# Patient Record
Sex: Female | Born: 1980 | Race: White | Hispanic: No | Marital: Married | Smoking: Former smoker
Health system: Southern US, Community
[De-identification: ages and names within clinical notes are randomized; demographics above are authoritative.]

## PROBLEM LIST (undated history)

## (undated) ENCOUNTER — Inpatient Hospital Stay (HOSPITAL_COMMUNITY): Payer: Self-pay

## (undated) DIAGNOSIS — F329 Major depressive disorder, single episode, unspecified: Secondary | ICD-10-CM

## (undated) DIAGNOSIS — F32A Depression, unspecified: Secondary | ICD-10-CM

## (undated) DIAGNOSIS — Z8619 Personal history of other infectious and parasitic diseases: Secondary | ICD-10-CM

## (undated) DIAGNOSIS — F419 Anxiety disorder, unspecified: Secondary | ICD-10-CM

## (undated) DIAGNOSIS — O139 Gestational [pregnancy-induced] hypertension without significant proteinuria, unspecified trimester: Secondary | ICD-10-CM

## (undated) HISTORY — DX: Gestational (pregnancy-induced) hypertension without significant proteinuria, unspecified trimester: O13.9

## (undated) HISTORY — DX: Personal history of other infectious and parasitic diseases: Z86.19

## (undated) HISTORY — PX: NECK SURGERY: SHX720

## (undated) HISTORY — PX: WISDOM TOOTH EXTRACTION: SHX21

## (undated) HISTORY — PX: BUNIONECTOMY: SHX129

---

## 2001-11-23 ENCOUNTER — Emergency Department (HOSPITAL_COMMUNITY): Admission: EM | Admit: 2001-11-23 | Discharge: 2001-11-23 | Payer: Self-pay | Admitting: Physical Therapy

## 2002-11-08 ENCOUNTER — Emergency Department (HOSPITAL_COMMUNITY): Admission: EM | Admit: 2002-11-08 | Discharge: 2002-11-08 | Payer: Self-pay | Admitting: Emergency Medicine

## 2004-10-24 ENCOUNTER — Other Ambulatory Visit: Admission: RE | Admit: 2004-10-24 | Discharge: 2004-10-24 | Payer: Self-pay | Admitting: Family Medicine

## 2007-06-29 ENCOUNTER — Other Ambulatory Visit: Admission: RE | Admit: 2007-06-29 | Discharge: 2007-06-29 | Payer: Self-pay | Admitting: Family Medicine

## 2009-01-29 ENCOUNTER — Other Ambulatory Visit: Admission: RE | Admit: 2009-01-29 | Discharge: 2009-01-29 | Payer: Self-pay | Admitting: Family Medicine

## 2009-08-28 ENCOUNTER — Emergency Department (HOSPITAL_COMMUNITY): Admission: EM | Admit: 2009-08-28 | Discharge: 2009-08-28 | Payer: Self-pay | Admitting: Emergency Medicine

## 2010-03-14 ENCOUNTER — Other Ambulatory Visit: Admission: RE | Admit: 2010-03-14 | Discharge: 2010-03-14 | Payer: Self-pay | Admitting: Family Medicine

## 2010-10-19 NOTE — L&D Delivery Note (Signed)
Delivery Note At 1:23 PM a viable female was delivered via  (Presentation: ;  ).  APGAR:9/9 , ; weight 5 lb 11.9 oz (2605 g).   Placenta status intact: , 3 vessel.  Cord:  with the following complications: .  Cord pH: pending  Anesthesia:  epidual Episiotomy: none Lacerations: second degree Suture Repair: chromic Est. Blood Loss (mL):   Mom to postpartum.  Baby to nursery-stable some retractions nursery to eval.  Ravinder Hofland S 07/19/2011, 1:42 PM

## 2011-01-13 LAB — HIV ANTIBODY (ROUTINE TESTING W REFLEX): HIV: NONREACTIVE

## 2011-01-13 LAB — ANTIBODY SCREEN: Antibody Screen: NEGATIVE

## 2011-01-13 LAB — ABO/RH: RH Type: POSITIVE

## 2011-06-20 ENCOUNTER — Inpatient Hospital Stay (HOSPITAL_COMMUNITY): Admission: AD | Admit: 2011-06-20 | Payer: Self-pay | Source: Ambulatory Visit | Admitting: Obstetrics and Gynecology

## 2011-07-18 ENCOUNTER — Inpatient Hospital Stay (HOSPITAL_COMMUNITY)
Admission: AD | Admit: 2011-07-18 | Discharge: 2011-07-21 | DRG: 775 | Disposition: A | Discharge status: Home / Self Care | Payer: 59 | Source: Ambulatory Visit | Attending: Obstetrics and Gynecology | Admitting: Obstetrics and Gynecology

## 2011-07-18 ENCOUNTER — Encounter (HOSPITAL_COMMUNITY): Payer: Self-pay | Admitting: *Deleted

## 2011-07-18 HISTORY — DX: Depression, unspecified: F32.A

## 2011-07-18 HISTORY — DX: Anxiety disorder, unspecified: F41.9

## 2011-07-18 HISTORY — DX: Major depressive disorder, single episode, unspecified: F32.9

## 2011-07-18 NOTE — Progress Notes (Signed)
"  I started having UC's while I was eating.  I began to feel nauseated, I laid down, had sone water to drink and UC's kept coming.  That's when I called the RN line.  No leaking of water or bleeding from vagina.  (+) FM."

## 2011-07-18 NOTE — Progress Notes (Signed)
Pt states, " I started having cramping in my low abdomen at 2200 tonight. They are infrequent."

## 2011-07-19 ENCOUNTER — Other Ambulatory Visit: Payer: Self-pay | Admitting: Obstetrics and Gynecology

## 2011-07-19 ENCOUNTER — Encounter (HOSPITAL_COMMUNITY): Payer: Self-pay | Admitting: Anesthesiology

## 2011-07-19 ENCOUNTER — Inpatient Hospital Stay (HOSPITAL_COMMUNITY): Payer: 59 | Admitting: Anesthesiology

## 2011-07-19 ENCOUNTER — Encounter (HOSPITAL_COMMUNITY): Payer: Self-pay | Admitting: *Deleted

## 2011-07-19 LAB — CBC
Hemoglobin: 12.9 g/dL (ref 12.0–15.0)
MCH: 31.4 pg (ref 26.0–34.0)
Platelets: 245 10*3/uL (ref 150–400)
RBC: 4.11 MIL/uL (ref 3.87–5.11)
WBC: 18.3 10*3/uL — ABNORMAL HIGH (ref 4.0–10.5)

## 2011-07-19 LAB — URINALYSIS, ROUTINE W REFLEX MICROSCOPIC
Glucose, UA: NEGATIVE mg/dL
Leukocytes, UA: NEGATIVE
Nitrite: NEGATIVE
Specific Gravity, Urine: <1.005 — ABNORMAL LOW (ref 1.005–1.030)
pH: 6 (ref 5.0–8.0)

## 2011-07-19 LAB — URINE MICROSCOPIC-ADD ON

## 2011-07-19 LAB — RPR: RPR Ser Ql: NONREACTIVE

## 2011-07-19 MED ORDER — BENZOCAINE-MENTHOL 20-0.5 % EX AERO
1.0000 "application " | INHALATION_SPRAY | CUTANEOUS | Status: DC | PRN
  Administered 2011-07-19 – 2011-07-21 (×2): 1 via TOPICAL

## 2011-07-19 MED ORDER — LACTATED RINGERS IV SOLN: 500.0000 mL | INTRAVENOUS | Status: DC | PRN

## 2011-07-19 MED ORDER — ACETAMINOPHEN 325 MG PO TABS: 650.0000 mg | ORAL_TABLET | ORAL | Status: DC | PRN

## 2011-07-19 MED ORDER — DIPHENHYDRAMINE HCL 25 MG PO CAPS: 25.0000 mg | ORAL_CAPSULE | Freq: Four times a day (QID) | ORAL | Status: DC | PRN

## 2011-07-19 MED ORDER — ONDANSETRON HCL 4 MG/2ML IJ SOLN: 4.0000 mg | INTRAMUSCULAR | Status: DC | PRN

## 2011-07-19 MED ORDER — PHENYLEPHRINE 40 MCG/ML (10ML) SYRINGE FOR IV PUSH (FOR BLOOD PRESSURE SUPPORT)
80.0000 ug | PREFILLED_SYRINGE | INTRAVENOUS | Status: DC | PRN
  Filled 2011-07-19: qty 5

## 2011-07-19 MED ORDER — TERBUTALINE SULFATE 1 MG/ML IJ SOLN
INTRAMUSCULAR | Status: AC
  Administered 2011-07-19: 0.25 mg via SUBCUTANEOUS
  Filled 2011-07-19: qty 1

## 2011-07-19 MED ORDER — OXYTOCIN 20 UNITS IN LACTATED RINGERS INFUSION - SIMPLE
1.0000 m[IU]/min | INTRAVENOUS | Status: DC
  Filled 2011-07-19: qty 1000

## 2011-07-19 MED ORDER — TETANUS-DIPHTH-ACELL PERTUSSIS 5-2.5-18.5 LF-MCG/0.5 IM SUSP: 0.5000 mL | Freq: Once | INTRAMUSCULAR | Status: DC

## 2011-07-19 MED ORDER — PRENATAL PLUS 27-1 MG PO TABS
1.0000 | ORAL_TABLET | Freq: Every day | ORAL | Status: DC
  Administered 2011-07-20 – 2011-07-21 (×2): 1 via ORAL
  Filled 2011-07-19 (×2): qty 1

## 2011-07-19 MED ORDER — PRENATAL PLUS 27-1 MG PO TABS: 1.0000 | ORAL_TABLET | Freq: Every day | ORAL | Status: DC

## 2011-07-19 MED ORDER — BUPROPION HCL ER (XL) 150 MG PO TB24
150.0000 mg | ORAL_TABLET | Freq: Every day | ORAL | Status: DC
  Administered 2011-07-19 – 2011-07-21 (×3): 150 mg via ORAL
  Filled 2011-07-19 (×4): qty 1

## 2011-07-19 MED ORDER — EPHEDRINE 5 MG/ML INJ
10.0000 mg | INTRAVENOUS | Status: DC | PRN
  Filled 2011-07-19: qty 4

## 2011-07-19 MED ORDER — DIBUCAINE 1 % RE OINT: 1.0000 "application " | TOPICAL_OINTMENT | RECTAL | Status: DC | PRN

## 2011-07-19 MED ORDER — IBUPROFEN 600 MG PO TABS: 600.0000 mg | ORAL_TABLET | Freq: Four times a day (QID) | ORAL | Status: DC | PRN

## 2011-07-19 MED ORDER — FLEET ENEMA 7-19 GM/118ML RE ENEM: 1.0000 | ENEMA | RECTAL | Status: DC | PRN

## 2011-07-19 MED ORDER — LIDOCAINE HCL 1.5 % IJ SOLN
INTRAMUSCULAR | Status: DC | PRN
  Administered 2011-07-19 (×2): 5 mL via INTRADERMAL

## 2011-07-19 MED ORDER — BENZOCAINE-MENTHOL 20-0.5 % EX AERO
INHALATION_SPRAY | CUTANEOUS | Status: AC
  Administered 2011-07-19: 1 via TOPICAL
  Filled 2011-07-19: qty 56

## 2011-07-19 MED ORDER — CLINDAMYCIN PHOSPHATE 900 MG/50ML IV SOLN
900.0000 mg | Freq: Three times a day (TID) | INTRAVENOUS | Status: DC
  Administered 2011-07-19 (×2): 900 mg via INTRAVENOUS
  Filled 2011-07-19 (×4): qty 50

## 2011-07-19 MED ORDER — SENNOSIDES-DOCUSATE SODIUM 8.6-50 MG PO TABS
2.0000 | ORAL_TABLET | Freq: Every day | ORAL | Status: DC
  Administered 2011-07-19 – 2011-07-20 (×2): 2 via ORAL

## 2011-07-19 MED ORDER — TERBUTALINE SULFATE 1 MG/ML IJ SOLN: 0.2500 mg | Freq: Once | INTRAMUSCULAR | Status: DC | PRN

## 2011-07-19 MED ORDER — IBUPROFEN 600 MG PO TABS
600.0000 mg | ORAL_TABLET | Freq: Four times a day (QID) | ORAL | Status: DC
  Administered 2011-07-19 – 2011-07-21 (×8): 600 mg via ORAL
  Filled 2011-07-19 (×8): qty 1

## 2011-07-19 MED ORDER — PHENYLEPHRINE 40 MCG/ML (10ML) SYRINGE FOR IV PUSH (FOR BLOOD PRESSURE SUPPORT)
80.0000 ug | PREFILLED_SYRINGE | INTRAVENOUS | Status: DC | PRN
  Filled 2011-07-19 (×2): qty 5

## 2011-07-19 MED ORDER — OXYCODONE-ACETAMINOPHEN 5-325 MG PO TABS
1.0000 | ORAL_TABLET | ORAL | Status: DC | PRN
Start: 2011-07-19 — End: 2011-07-21

## 2011-07-19 MED ORDER — ZOLPIDEM TARTRATE 5 MG PO TABS: 5.0000 mg | ORAL_TABLET | Freq: Every evening | ORAL | Status: DC | PRN

## 2011-07-19 MED ORDER — WITCH HAZEL-GLYCERIN EX PADS: 1.0000 "application " | MEDICATED_PAD | CUTANEOUS | Status: DC | PRN

## 2011-07-19 MED ORDER — FENTANYL 2.5 MCG/ML BUPIVACAINE 1/10 % EPIDURAL INFUSION (WH - ANES)
14.0000 mL/h | INTRAMUSCULAR | Status: DC
  Filled 2011-07-19: qty 60

## 2011-07-19 MED ORDER — LACTATED RINGERS IV SOLN: 500.0000 mL | Freq: Once | INTRAVENOUS | Status: DC

## 2011-07-19 MED ORDER — CITRIC ACID-SODIUM CITRATE 334-500 MG/5ML PO SOLN: 30.0000 mL | ORAL | Status: DC | PRN

## 2011-07-19 MED ORDER — LANOLIN HYDROUS EX OINT: TOPICAL_OINTMENT | CUTANEOUS | Status: DC | PRN

## 2011-07-19 MED ORDER — ONDANSETRON HCL 4 MG PO TABS: 4.0000 mg | ORAL_TABLET | ORAL | Status: DC | PRN

## 2011-07-19 MED ORDER — OXYTOCIN 20 UNITS IN LACTATED RINGERS INFUSION - SIMPLE: 125.0000 mL/h | Freq: Once | INTRAVENOUS | Status: DC

## 2011-07-19 MED ORDER — TERBUTALINE SULFATE 1 MG/ML IJ SOLN
0.2500 mg | Freq: Once | INTRAMUSCULAR | Status: AC
  Administered 2011-07-19: 0.25 mg via SUBCUTANEOUS

## 2011-07-19 MED ORDER — SIMETHICONE 80 MG PO CHEW: 80.0000 mg | CHEWABLE_TABLET | ORAL | Status: DC | PRN

## 2011-07-19 MED ORDER — OXYTOCIN BOLUS FROM INFUSION
500.0000 mL | Freq: Once | INTRAVENOUS | Status: DC
  Filled 2011-07-19: qty 500

## 2011-07-19 MED ORDER — ONDANSETRON HCL 4 MG/2ML IJ SOLN
4.0000 mg | Freq: Four times a day (QID) | INTRAMUSCULAR | Status: DC | PRN
  Administered 2011-07-19: 4 mg via INTRAVENOUS
  Filled 2011-07-19: qty 2

## 2011-07-19 MED ORDER — DIPHENHYDRAMINE HCL 50 MG/ML IJ SOLN: 12.5000 mg | INTRAMUSCULAR | Status: DC | PRN

## 2011-07-19 MED ORDER — LIDOCAINE HCL (PF) 1 % IJ SOLN
30.0000 mL | INTRAMUSCULAR | Status: DC | PRN
  Filled 2011-07-19 (×2): qty 30

## 2011-07-19 MED ORDER — LACTATED RINGERS IV SOLN
INTRAVENOUS | Status: DC
  Administered 2011-07-19: 09:00:00 via INTRAVENOUS
  Administered 2011-07-19: 125 mL/h via INTRAVENOUS

## 2011-07-19 MED ORDER — BISACODYL 10 MG RE SUPP: 10.0000 mg | Freq: Every day | RECTAL | Status: DC | PRN

## 2011-07-19 MED ORDER — FENTANYL 2.5 MCG/ML BUPIVACAINE 1/10 % EPIDURAL INFUSION (WH - ANES)
INTRAMUSCULAR | Status: DC | PRN
  Administered 2011-07-19: 14 mL/h via EPIDURAL

## 2011-07-19 MED ORDER — OXYCODONE-ACETAMINOPHEN 5-325 MG PO TABS: 2.0000 | ORAL_TABLET | ORAL | Status: DC | PRN

## 2011-07-19 MED ORDER — EPHEDRINE 5 MG/ML INJ
10.0000 mg | INTRAVENOUS | Status: DC | PRN
  Filled 2011-07-19 (×2): qty 4

## 2011-07-19 NOTE — ED Provider Notes (Signed)
History     Chief Complaint  Patient presents with  . Contractions   HPI Nicole Kelly 30 y.o. 35w 5d gestation comes in with contractions tonight.  This is the first time her contractions have been regular and she is having lower abdominal pain with the contractions.  No leaking.  No vaginal bleeding.  OB History    Grav Para Term Preterm Abortions TAB SAB Ect Mult Living   1               Past Medical History  Diagnosis Date  . No pertinent past medical history     Past Surgical History  Procedure Date  . No past surgeries     No family history on file.  History  Substance Use Topics  . Smoking status: Never Smoker   . Smokeless tobacco: Not on file  . Alcohol Use: No    Allergies:  Allergies  Allergen Reactions  . Penicillins Nausea Only  . Sulfa Antibiotics Nausea Only    Prescriptions prior to admission  Medication Sig Dispense Refill  . acetaminophen (TYLENOL) 500 MG tablet Take 500 mg by mouth every 6 (six) hours as needed.        Marland Kitchen buPROPion (WELLBUTRIN XL) 150 MG 24 hr tablet Take 150 mg by mouth daily.        . Calcium Carbonate Antacid (TUMS PO) Take 1 tablet by mouth daily.        . prenatal vitamin w/FE, FA (PRENATAL 1 + 1) 27-1 MG TABS Take 1 tablet by mouth daily.          Review of Systems  Genitourinary:       Uterine contractions    Physical Exam   Blood pressure 125/87, pulse 87, temperature 98.7 F (37.1 C), temperature source Oral, resp. rate 20, height 5\' 3"  (1.6 m), weight 141 lb (63.957 kg).  Physical Exam  Nursing note and vitals reviewed. Constitutional: She is oriented to person, place, and time. She appears well-developed and well-nourished.  HENT:  Head: Normocephalic.  Eyes: EOM are normal.  Neck: Neck supple.  GI: Soft.       Contractions noted FHT 125 baseline with 15x15 accelerations noted, reactive strip  Genitourinary:       Cervical exam 3 cm at 0245, 50 %  Musculoskeletal: Normal range of motion.    Neurological: She is alert and oriented to person, place, and time.  Skin: Skin is warm and dry.  Psychiatric: She has a normal mood and affect.    MAU Course  Procedures  MDM 1201 - consult with Dr. Arelia Sneddon.  Will try one dose of Terbutaline SQ to stop contractions. Terbutaline slowed contractions to 6 minutes for a short time and then contractions returned to 3-4 minutes. Client walked for one hour and cervix rechecked.  3 cm 50% bloody show, vtx at 0 station  Assessment and Plan  Labor at 35 weeks  Plan: Will admit  Nicole Kelly,Nicole Kelly 07/19/2011, 12:04 AM   Nolene Bernheim, NP 07/19/11 4098

## 2011-07-19 NOTE — Anesthesia Postprocedure Evaluation (Signed)
Anesthesia Post Note  Patient: Nicole Kelly  Procedure(s) Performed: * No procedures listed *  Anesthesia type: Epidural  Patient location: Mother/Baby  Post pain: Pain level controlled  Post assessment: Post-op Vital signs reviewed  Last Vitals:  Filed Vitals:   07/19/11 1601  BP: 129/81  Pulse: 85  Temp: 98 F (36.7 C)  Resp: 16    Post vital signs: Reviewed  Level of consciousness: awake  Complications: No apparent anesthesia complications

## 2011-07-19 NOTE — Anesthesia Procedure Notes (Signed)
Spinal Block   Epidural Patient location during procedure: OB Start time: 07/19/2011 9:47 AM End time: 07/19/2011 9:53 AM Reason for block: procedure for pain  Staffing Anesthesiologist: Sandrea Hughs Performed by: anesthesiologist   Preanesthetic Checklist Completed: patient identified, site marked, surgical consent, pre-op evaluation, timeout performed, IV checked, risks and benefits discussed and monitors and equipment checked  Epidural Patient position: sitting Prep: site prepped and draped and DuraPrep Patient monitoring: continuous pulse ox and blood pressure Approach: midline Injection technique: LOR air  Needle:  Needle type: Tuohy  Needle gauge: 17 G Needle length: 9 cm Needle insertion depth: 5 cm cm Catheter type: closed end flexible Catheter size: 19 Gauge Catheter at skin depth: 10 cm Test dose: negative and 1.5% lidocaine  Assessment Events: blood not aspirated, injection not painful, no injection resistance, negative IV test and no paresthesia

## 2011-07-19 NOTE — Anesthesia Postprocedure Evaluation (Signed)
  Anesthesia Post-op Note  Patient: Nicole Kelly  Procedure(s) Performed: * No procedures listed *  Patient Location: PACU and Mother/Baby  Anesthesia Type: Epidural  Level of Consciousness: awake, alert  and oriented  Airway and Oxygen Therapy: Patient Spontanous Breathing   Post-op Assessment: Patient's Cardiovascular Status Stable and Respiratory Function Stable  Post-op Vital Signs: stable  Complications: No apparent anesthesia complications

## 2011-07-19 NOTE — Progress Notes (Signed)
Patient has epidural fhr reactive no decel. Ctx's spacing out. fhr reactive  Begin pitocin risk discussed

## 2011-07-19 NOTE — Progress Notes (Signed)
Encounter addended by: Len Blalock on: 07/19/2011  6:26 PM<BR>     Documentation filed: Notes Section, Charges VN

## 2011-07-19 NOTE — Plan of Care (Signed)
Problem: Consults Goal: Postpartum Patient Education (See Patient Education module for education specifics.) Outcome: Progressing Infant went to NICU for RDS

## 2011-07-19 NOTE — Anesthesia Preprocedure Evaluation (Signed)
Anesthesia Evaluation  Name, MR# and DOB Patient awake  General Assessment Comment  Reviewed: Allergy & Precautions, H&P , NPO status , Patient's Chart, lab work & pertinent test results  Airway Mallampati: I TM Distance: >3 FB Neck ROM: full    Dental No notable dental hx.    Pulmonary  clear to auscultation  Pulmonary exam normal       Cardiovascular     Neuro/Psych Negative Neurological ROS     GI/Hepatic negative GI ROS Neg liver ROS    Endo/Other  Negative Endocrine ROS  Renal/GU negative Renal ROS  Genitourinary negative   Musculoskeletal negative musculoskeletal ROS (+)   Abdominal Normal abdominal exam  (+)   Peds  Hematology negative hematology ROS (+)   Anesthesia Other Findings   Reproductive/Obstetrics (+) Pregnancy                           Anesthesia Physical Anesthesia Plan  ASA: II  Anesthesia Plan: Epidural   Post-op Pain Management:    Induction:   Airway Management Planned:   Additional Equipment:   Intra-op Plan:   Post-operative Plan:   Informed Consent: I have reviewed the patients History and Physical, chart, labs and discussed the procedure including the risks, benefits and alternatives for the proposed anesthesia with the patient or authorized representative who has indicated his/her understanding and acceptance.     Plan Discussed with:   Anesthesia Plan Comments:         Anesthesia Quick Evaluation

## 2011-07-19 NOTE — H&P (Signed)
Nicole Kelly is a 30 y.o. female  AT 46 6/7 WITH SOL UNCOMPLICATED PNC. GBS UNKNOWN. Maternal Medical History:  Reason for admission: Reason for admission: contractions.  Contractions: Onset was 1-2 hours ago.   Frequency: regular.   Perceived severity is moderate.    Prenatal Complications - Diabetes: none.    OB History    Grav Para Term Preterm Abortions TAB SAB Ect Mult Living   1              Past Medical History  Diagnosis Date  . Anxiety   . Depression    Past Surgical History  Procedure Date  . No past surgeries    Family History: family history is not on file. Social History:  reports that she has never smoked. She has never used smokeless tobacco. She reports that she does not drink alcohol or use illicit drugs.  ROS  Dilation: 4.5 Effacement (%): 80 Station: -1 Exam by:: Dr. Arelia Sneddon Blood pressure 120/78, pulse 82, temperature 98.1 F (36.7 C), temperature source Oral, resp. rate 18, height 5\' 3"  (1.6 m), weight 141 lb (63.957 kg), SpO2 97.00%. Maternal Exam:  Uterine Assessment: Contraction strength is moderate.  Contraction frequency is regular.   Abdomen: Patient reports no abdominal tenderness. Fundal height is c/w dates.   Estimated fetal weight is 6 lbs.   Fetal presentation: vertex  Introitus: Amniotic fluid character: clear.  Pelvis: adequate for delivery.   Cervix: Cervix evaluated by digital exam.     Physical Exam  Prenatal labs: ABO, Rh:  O +                    Antibody:  NEG Rubella:  IMMUNE RPR:   NR HBsAg:   NEG HIV:   NEG GBS:   UNKNOWN  Assessment/Plan: IUP AT 35 6/7 WITH SOL.  UNKNOWN GBS  ROUTINE L AND D.  CLEOCIN FOR GBS Prynce Jacober S 07/19/2011, 8:09 AM

## 2011-07-20 LAB — CBC
Hemoglobin: 11.5 g/dL — ABNORMAL LOW (ref 12.0–15.0)
RBC: 3.74 MIL/uL — ABNORMAL LOW (ref 3.87–5.11)

## 2011-07-20 NOTE — Progress Notes (Signed)
Post Partum Day 1  Subjective: no complaints  Objective: Blood pressure 102/69, pulse 79, temperature 98.3 F (36.8 C), temperature source Oral, resp. rate 16, height 5\' 3"  (1.6 m), weight 63.957 kg (141 lb), SpO2 99.00%, unknown if currently breastfeeding.  Physical Exam:  General: alert Lochia: appropriate Uterine Fundus: firm Incision: healing well DVT Evaluation: No evidence of DVT seen on physical exam.   Basename 07/20/11 0537 07/19/11 0331  HGB 11.5* 12.9  HCT 33.9* 36.9    Assessment/Plan: Plan for discharge tomorrow Baby in [place] due to prematurity   LOS: 2 days   Nicole Kelly L 07/20/2011, 8:56 AM

## 2011-07-21 ENCOUNTER — Encounter (HOSPITAL_COMMUNITY)
Admission: RE | Admit: 2011-07-21 | Discharge: 2011-07-21 | Disposition: A | Discharge status: Home / Self Care | Payer: 59 | Source: Ambulatory Visit | Attending: Obstetrics and Gynecology | Admitting: Obstetrics and Gynecology

## 2011-07-21 DIAGNOSIS — O923 Agalactia: Secondary | ICD-10-CM | POA: Insufficient documentation

## 2011-07-21 MED ORDER — BENZOCAINE-MENTHOL 20-0.5 % EX AERO
INHALATION_SPRAY | CUTANEOUS | Status: AC
  Filled 2011-07-21: qty 56

## 2011-07-21 NOTE — Discharge Summary (Signed)
Obstetric Discharge Summary Reason for Admission: onset of labor Prenatal Procedures: none Intrapartum Procedures: spontaneous vaginal delivery Postpartum Procedures: none Complications-Operative and Postpartum: none Hemoglobin  Date Value Range Status  07/20/2011 11.5* 12.0-15.0 (g/dL) Final     HCT  Date Value Range Status  07/20/2011 33.9* 36.0-46.0 (%) Final    Discharge Diagnoses: pretem delivery  Discharge Information: Date: 07/21/2011 Activity: pelvic rest Diet: routine Medications: PNV and Ibuprofen Condition: stable Instructions: refer to practice specific booklet Discharge to: home Follow-up Information    Follow up in 4 weeks.         Newborn Data: Live born female  Birth Weight: 5 lb 11.9 oz (2605 g) APGAR: 8, 9  Infant in NICU  Nicole Kelly 07/21/2011, 9:01 AM

## 2011-07-21 NOTE — Progress Notes (Signed)
Post Partum Day 2 Subjective: no complaints.  Baby in NICU  Objective: Blood pressure 97/64, pulse 73, temperature 97.2 F (36.2 C), temperature source Oral, resp. rate 18, height 5\' 3"  (1.6 m), weight 63.957 kg (141 lb), SpO2 99.00%, unknown if currently breastfeeding.  Physical Exam:  General: alert and cooperative Lochia: appropriate Uterine Fundus: firm Incision: n/a DVT Evaluation: No evidence of DVT seen on physical exam.   Basename 07/20/11 0537 07/19/11 0331  HGB 11.5* 12.9  HCT 33.9* 36.9    Assessment/Plan: Discharge home   LOS: 3 days   Nicole Kelly 07/21/2011, 8:58 AM

## 2011-07-21 NOTE — Progress Notes (Signed)
CLINICAL SOCIAL WORK  BRIEF PSYCHOSOCIAL ASSESSMENT  Referred by: NICU     On: 07/21/11   For: NICU Support      Patient Interview_X_ Family Interview_X_  Other: MOB's chart   PSYCHOSOCIAL DATA:   Lives Alone  Lives with: Baby to be discharged to parents' home   Primary Support (Name/Relationship): Summers and Brandon Banker/parents Degree of support available:   CURRENT CONCERNS:     None noted Substance Abuse     Behavioral Health Issues_X_-MOB hx of Anx/Dep    Financial Resources     Abuse/Neglect/Domestic Violence   Cultural/Religious Issues     Post-Acute Placement    Adjustment to Illness     Knowledge/Cognitive Deficit      Other:     SOCIAL WORK ASSESSMENT/PLAN:  SW met with parents in MOB's first floor room to introduce myself, complete assessment and evaluate how they are coping with baby's admission to NICU.  SW explained support services offered by NICU SWs and gave contact information.  SW asked parents to update SW on baby's NICU course to evaluate their understanding of the situation.  SW read in MOB's chart that she has a history of Anx/Dep, but also notes that she is currently on medication for this.  SW to monitor for signs of PPD while baby is in NICU.  No Further Intervention Required  _X_Psychosocial Support/Ongoing Assessment of Needs Information/Referral to Community Resources Other  PATIENT'S/FAMILY'S RESPONSE TO PLAN OF CARE:  Parents were very friendly.  FOB did most of the talking.  They stated that they were on their way to visit baby in NICU when SW came by.  They state no questions, concerns or needs at this time and state that everyone in the hospital has been very friendly and helpful.  They appear to have a very good understanding of the situation and appear to be coping well at this time.  They state no issues with transportation getting back to visit with baby since MOB is discharging today. 

## 2011-08-04 ENCOUNTER — Ambulatory Visit (HOSPITAL_COMMUNITY)
Admission: RE | Admit: 2011-08-04 | Discharge: 2011-08-04 | Disposition: A | Discharge status: Home / Self Care | Payer: 59 | Source: Ambulatory Visit | Attending: Obstetrics and Gynecology | Admitting: Obstetrics and Gynecology

## 2011-08-04 NOTE — Progress Notes (Signed)
Adult Lactation Consultation Outpatient Visit Note  Patient Name: Nicole Kelly    BABY:  Nicole Kelly Date of Birth: [birth year]              DOB:  07/19/2012 Gestational Age at Delivery:35.6 BIRTH WEIGHT 5-11 Type of Delivery: NSVD               WEIGHT TODAY  5-13.8  Breastfeeding History: Frequency of Breastfeeding: EVERY 2-4 HOURS Length of Feeding: 30 + Voids: 6-8 Stools: 6-8  Supplementing / Method: Pumping:  Type of Pump:SYMPHONY   Frequency:6 TIMES/24 HOURS  Volume:  3 OZ TOTAL  Comments:    Consultation Evaluation :Nicole latches easily and well to breast.  She still does a lot of non- nutritive sucking.  Mom's breast extra full, last pumped 8 hours ago.  Plan is to continue breastfeeding on demand at least 8 times in 24 hours. 2) pump both breasts after feeding 6 times/day. 3)  Offer bottle after breastfeeding ad lib  Initial Feeding Assessment: Pre-feed [id number]:0454 Post-feed UJWJXB:1478 Amount Transferred:26 Comments:RIGHT BREAST 20 MINUTES  Additional Feeding Assessment: Pre-feed GNFAOZ:3086 Post-feed Weight:2702 Amount Transferred:16 Comments:LEFT BREAST 10 MINUTES  Additional Feeding Assessment: Pre-feed Weight: Post-feed Weight: Amount Transferred: Comments:  Total Breast milk Transferred this Visit: 42 MLS Total Supplement Given: NONE  Additional Interventions:   Follow-Up  [facility] OFFICE 08/18/11 1:00PM      Hansel Feinstein 08/04/2011, 1:24 PM

## 2011-08-18 ENCOUNTER — Encounter (HOSPITAL_COMMUNITY): Payer: 59

## 2011-08-21 ENCOUNTER — Encounter (HOSPITAL_COMMUNITY)
Admission: RE | Admit: 2011-08-21 | Discharge: 2011-08-21 | Disposition: A | Discharge status: Home / Self Care | Payer: 59 | Source: Ambulatory Visit | Attending: Obstetrics and Gynecology | Admitting: Obstetrics and Gynecology

## 2011-08-21 DIAGNOSIS — O923 Agalactia: Secondary | ICD-10-CM | POA: Insufficient documentation

## 2013-05-02 LAB — OB RESULTS CONSOLE ABO/RH: RH Type: POSITIVE

## 2013-05-02 LAB — OB RESULTS CONSOLE ANTIBODY SCREEN: ANTIBODY SCREEN: NEGATIVE

## 2013-05-02 LAB — OB RESULTS CONSOLE HIV ANTIBODY (ROUTINE TESTING): HIV: NONREACTIVE

## 2013-05-02 LAB — OB RESULTS CONSOLE RPR: RPR: NONREACTIVE

## 2013-05-02 LAB — OB RESULTS CONSOLE RUBELLA ANTIBODY, IGM: RUBELLA: IMMUNE

## 2013-05-02 LAB — OB RESULTS CONSOLE GC/CHLAMYDIA
CHLAMYDIA, DNA PROBE: NEGATIVE
Gonorrhea: NEGATIVE

## 2013-05-02 LAB — OB RESULTS CONSOLE HEPATITIS B SURFACE ANTIGEN: HEP B S AG: NEGATIVE

## 2013-07-06 ENCOUNTER — Inpatient Hospital Stay (HOSPITAL_COMMUNITY)
Admission: AD | Admit: 2013-07-06 | Discharge: 2013-07-06 | Disposition: A | Discharge status: Home / Self Care | Payer: 59 | Source: Ambulatory Visit | Attending: Obstetrics and Gynecology | Admitting: Obstetrics and Gynecology

## 2013-07-06 ENCOUNTER — Encounter (HOSPITAL_COMMUNITY): Payer: Self-pay | Admitting: *Deleted

## 2013-07-06 DIAGNOSIS — N949 Unspecified condition associated with female genital organs and menstrual cycle: Secondary | ICD-10-CM | POA: Insufficient documentation

## 2013-07-06 DIAGNOSIS — O99891 Other specified diseases and conditions complicating pregnancy: Secondary | ICD-10-CM | POA: Insufficient documentation

## 2013-07-06 DIAGNOSIS — O26899 Other specified pregnancy related conditions, unspecified trimester: Secondary | ICD-10-CM

## 2013-07-06 DIAGNOSIS — R109 Unspecified abdominal pain: Secondary | ICD-10-CM | POA: Insufficient documentation

## 2013-07-06 DIAGNOSIS — O9989 Other specified diseases and conditions complicating pregnancy, childbirth and the puerperium: Secondary | ICD-10-CM

## 2013-07-06 LAB — WET PREP, GENITAL
Clue Cells Wet Prep HPF POC: NONE SEEN
Yeast Wet Prep HPF POC: NONE SEEN

## 2013-07-06 LAB — URINALYSIS, ROUTINE W REFLEX MICROSCOPIC
Glucose, UA: NEGATIVE mg/dL
Leukocytes, UA: NEGATIVE
pH: 7 (ref 5.0–8.0)

## 2013-07-06 NOTE — Progress Notes (Signed)
Pt taking medication for both anxiety and depression

## 2013-07-06 NOTE — MAU Note (Signed)
Pt states she has 17 P injection today. Pt states she had some discomfort right after the injecition and then at 1800 pt states she started having pressure that originated at her pelvic floor. Then pain migrated to one side

## 2013-07-06 NOTE — MAU Provider Note (Signed)
  History     CSN: [id number]  Arrival date and time: 07/06/13 2037   First Provider Initiated Contact with Patient 07/06/13 2135      Chief Complaint  Patient presents with  . Abdominal Pain   HPI  Nicole Kelly is a 32 y.o. G2P0101 at [redacted]w[redacted]d who presents today with pelvic pain and pressure. She states that the pain started around 1730, and has stopped now. She denies VB or LOF. She has felt the baby move. She states that her last intercourse was about 48 hours. She denies any vaginal discharge.   Past Medical History  Diagnosis Date  . Anxiety   . Depression     Past Surgical History  Procedure Laterality Date  . No past surgeries      Family History  Problem Relation Age of Onset  . Heart disease Mother   . Heart disease Father   . Hypertension Father     History  Substance Use Topics  . Smoking status: Never Smoker   . Smokeless tobacco: Never Used  . Alcohol Use: No    Allergies:  Allergies  Allergen Reactions  . Penicillins Nausea Only  . Sulfa Antibiotics Nausea Only    Prescriptions prior to admission  Medication Sig Dispense Refill  . buPROPion (WELLBUTRIN XL) 150 MG 24 hr tablet Take 150 mg by mouth daily.        . hydroxyprogesterone caproate (DELALUTIN) 250 mg/mL OIL injection Inject 250 mg into the muscle once a week. On Thursday      . Prenatal Vit-Fe Fumarate-FA (PRENATAL MULTIVITAMIN) TABS tablet Take 1 tablet by mouth at bedtime.        ROS Physical Exam   Blood pressure 111/65, pulse 77, temperature 98 F (36.7 C), temperature source Oral, resp. rate 18, height 5\' 3"  (1.6 m), weight 53.298 kg (117 lb 8 oz), unknown if currently breastfeeding.  Physical Exam  Nursing note and vitals reviewed. Constitutional: She is oriented to person, place, and time. She appears well-developed and well-nourished. No distress.  Cardiovascular: Normal rate.   Respiratory: Effort normal.  GI: Soft. There is no tenderness.  Genitourinary:   External:  no lesion Vagina: small amount of white discharge Cervix: pink, smooth, closed/thick/high Uterus: AGA    Neurological: She is alert and oriented to person, place, and time.  Skin: Skin is warm and dry.  Psychiatric: She has a normal mood and affect.    MAU Course  Procedures  Results for orders placed during the hospital encounter of 07/06/13 (from the past 24 hour(s))  WET PREP, GENITAL     Status: Abnormal   Collection Time    07/06/13  9:45 PM      Result Value Range   Yeast Wet Prep HPF POC NONE SEEN  NONE SEEN   Trich, Wet Prep NONE SEEN  NONE SEEN   Clue Cells Wet Prep HPF POC NONE SEEN  NONE SEEN   WBC, Wet Prep HPF POC FEW (*) NONE SEEN      2156: C/W Dr. Marcelle Overlie, ok for DC home at this time. FU as planned with the office for 17-p injections.  Assessment and Plan   1. Pain of round ligament complicating pregnancy, antepartum    Comfort measures reviewed FU with Dr. Marcelle Overlie Return to MAU as needed   Tawnya Crook 07/06/2013, 9:44 PM

## 2013-07-06 NOTE — Progress Notes (Signed)
Pt states she had nausea earlier today when she first came home from her appointment

## 2013-07-06 NOTE — MAU Note (Signed)
PT SAYS   SHE IS GETTING PROGESTERONE--   SHOTS- THIS IS 2ND SHOT-  TODAY.     HAS HAD ROUND LIGAMENT PAIN TODAY.   AT 630PM - SHE STARTED  HAVING PRESSURE- THEN LAYED ON SOFA-  RADIATED TO OTHER SIDE AND TO HE BACK.   Marland Kitchen  CALLED DR - TOLD HER TO COME IN.  SINCE THEN IT IS LESS. .   NO MEDS TAKEN.

## 2013-10-19 NOTE — L&D Delivery Note (Signed)
Delivery Note. 247w4d   At 12:28 PM a viable female was delivered via  (Presentation: ;  ).  APGAR:99 , ; weight .   Placenta status: , .  Cord:loose nuchal X 1  with the following complications: .  Cord pH: not sent  Anesthesia: Epidural  Episiotomy: none Lacerations: first deg Suture Repair: 3.0 chromic Est. Blood Loss (mL): 300 Results for orders placed during the hospital encounter of 11/24/13 (from the past 24 hour(s))  CBC     Status: Abnormal   Collection Time    11/24/13  6:55 AM      Result Value Range   WBC 13.2 (*) 4.0 - 10.5 K/uL   RBC 4.77  3.87 - 5.11 MIL/uL   Hemoglobin 14.4  12.0 - 15.0 g/dL   HCT 16.142.2  09.636.0 - 04.546.0 %   MCV 88.5  78.0 - 100.0 fL   MCH 30.2  26.0 - 34.0 pg   MCHC 34.1  30.0 - 36.0 g/dL   RDW 40.913.3  81.111.5 - 91.415.5 %   Platelets 230  150 - 400 K/uL  COMPREHENSIVE METABOLIC PANEL     Status: Abnormal   Collection Time    11/24/13  8:06 AM      Result Value Range   Sodium 135 (*) 137 - 147 mEq/L   Potassium 4.3  3.7 - 5.3 mEq/L   Chloride 100  96 - 112 mEq/L   CO2 23  19 - 32 mEq/L   Glucose, Bld 85  70 - 99 mg/dL   BUN 10  6 - 23 mg/dL   Creatinine, Ser 7.820.70  0.50 - 1.10 mg/dL   Calcium 9.8  8.4 - 95.610.5 mg/dL   Total Protein 5.9 (*) 6.0 - 8.3 g/dL   Albumin 2.8 (*) 3.5 - 5.2 g/dL   AST 19  0 - 37 U/L   ALT 14  0 - 35 U/L   Alkaline Phosphatase 112  39 - 117 U/L   Total Bilirubin 0.3  0.3 - 1.2 mg/dL   GFR calc non Af Amer >90  >90 mL/min   GFR calc Af Amer >90  >90 mL/min   Mom to postpartum.  Baby to Nursery. I/O cath UA for proteinuria check Nicole Kelly,Nicole Kelly 11/24/2013, 12:43 PM

## 2013-11-13 LAB — OB RESULTS CONSOLE GBS: GBS: POSITIVE

## 2013-11-14 ENCOUNTER — Inpatient Hospital Stay (HOSPITAL_COMMUNITY)
Admission: AD | Admit: 2013-11-14 | Discharge: 2013-11-14 | Disposition: A | Discharge status: Home / Self Care | Payer: 59 | Source: Ambulatory Visit | Attending: Obstetrics and Gynecology | Admitting: Obstetrics and Gynecology

## 2013-11-14 ENCOUNTER — Encounter (HOSPITAL_COMMUNITY): Payer: Self-pay

## 2013-11-14 DIAGNOSIS — O4700 False labor before 37 completed weeks of gestation, unspecified trimester: Secondary | ICD-10-CM | POA: Insufficient documentation

## 2013-11-14 LAB — CBC
HCT: 38.1 % (ref 36.0–46.0)
HEMOGLOBIN: 13.3 g/dL (ref 12.0–15.0)
MCH: 31 pg (ref 26.0–34.0)
MCHC: 34.9 g/dL (ref 30.0–36.0)
MCV: 88.8 fL (ref 78.0–100.0)
Platelets: 250 10*3/uL (ref 150–400)
RBC: 4.29 MIL/uL (ref 3.87–5.11)
RDW: 13.7 % (ref 11.5–15.5)
WBC: 15.8 10*3/uL — ABNORMAL HIGH (ref 4.0–10.5)

## 2013-11-14 NOTE — Discharge Instructions (Signed)
Braxton Hicks Contractions Pregnancy is commonly associated with contractions of the uterus throughout the pregnancy. Towards the end of pregnancy (32 to 34 weeks), these contractions [facility](Braxton Willa [facility]) can develop more often and may become more forceful. This is not true labor because these contractions do not result in opening (dilatation) and thinning of the cervix. They are sometimes difficult to tell apart from true labor because these contractions can be forceful and people have different pain tolerances. You should not feel embarrassed if you go to the hospital with false labor. Sometimes, the only way to tell if you are in true labor is for your caregiver to follow the changes in the cervix. How to tell the difference between true and false labor:  False labor.  The contractions of false labor are usually shorter, irregular and not as hard as those of true labor.  They are often felt in the front of the lower abdomen and in the groin.  They may leave with walking around or changing positions while lying down.  They get weaker and are shorter lasting as time goes on.  These contractions are usually irregular.  They do not usually become progressively stronger, regular and closer together as with true labor.  True labor.  Contractions in true labor last 30 to 70 seconds, become very regular, usually become more intense, and increase in frequency.  They do not go away with walking.  The discomfort is usually felt in the top of the uterus and spreads to the lower abdomen and low back.  True labor can be determined by your caregiver with an exam. This will show that the cervix is dilating and getting thinner. If there are no prenatal problems or other health problems associated with the pregnancy, it is completely safe to be sent home with false labor and await the onset of true labor. HOME CARE INSTRUCTIONS   Keep up with your usual exercises and instructions.  Take medications as  directed.  Keep your regular prenatal appointment.  Eat and drink lightly if you think you are going into labor.  If BH contractions are making you uncomfortable:  Change your activity position from lying down or resting to walking/walking to resting.  Sit and rest in a tub of warm water.  Drink 2 to 3 glasses of water. Dehydration may cause B-H contractions.  Do slow and deep breathing several times an hour. SEEK IMMEDIATE MEDICAL CARE IF:   Your contractions continue to become stronger, more regular, and closer together.  You have a gushing, burst or leaking of fluid from the vagina.  An oral temperature above 102 F (38.9 C) develops.  You have passage of blood-tinged mucus.  You develop vaginal bleeding.  You develop continuous belly (abdominal) pain.  You have low back pain that you never had before.  You feel the baby's head pushing down causing pelvic pressure.  The baby is not moving as much as it used to. Document Released: 10/05/2005 Document Revised: 12/28/2011 Document Reviewed: 07/17/2013 [facility] Patient Information 2014 [place].  Preeclampsia and Eclampsia Preeclampsia is a condition of high blood pressure during pregnancy. It can happen at 20 weeks or later in pregnancy. If high blood pressure occurs in the second half of pregnancy with no other symptoms, it is called gestational hypertension and goes away after the baby is born. If any of the symptoms listed below develop with gestational hypertension, it is then called preeclampsia. Eclampsia (convulsions) may follow preeclampsia. This is one of the reasons for  regular prenatal checkups. Early diagnosis and treatment are very important to prevent eclampsia. CAUSES  There is no known cause of preeclampsia/eclampsia in pregnancy. There are several known conditions that may put the pregnant woman at risk, such as:  The first pregnancy.  Having preeclampsia in a past pregnancy.  Having lasting  (chronic) high blood pressure.  Having multiples (twins, triplets).  Being age 12 or older.  African American ethnic background.  Having kidney disease or diabetes.  Medical conditions such as lupus or blood diseases.  Being overweight (obese). SYMPTOMS   High blood pressure.  Headaches.  Sudden weight gain.  Swelling of hands, face, legs, and feet.  Protein in the urine.  Feeling sick to your stomach (nauseous) and throwing up (vomiting).  Vision problems (blurred or double vision).  Numbness in the face, arms, legs, and feet.  Dizziness.  Slurred speech.  Preeclampsia can cause growth retardation in the fetus.  Separation (abruption) of the placenta.  Not enough fluid in the amniotic sac (oligohydramnios).  Sensitivity to bright lights.  Belly (abdominal) pain. DIAGNOSIS  If protein is found in the urine in the second half of pregnancy, this is considered preeclampsia. Other symptoms mentioned above may also be present. TREATMENT  It is necessary to treat this.  Your caregiver may prescribe bed rest early in this condition. Plenty of rest and salt restriction may be all that is needed.  Medicines may be necessary to lower blood pressure if the condition does not respond to more conservative measures.  In more severe cases, hospitalization may be needed:  For treatment of blood pressure.  To control fluid retention.  To monitor the baby to see if the condition is causing harm to the baby.  Hospitalization is the best way to treat the first sign of preeclampsia. This is so the mother and baby can be watched closely and blood tests can be done effectively and correctly.  If the condition becomes severe, it may be necessary to induce labor or to remove the infant by surgical means (cesarean section). The best cure for preeclampsia/eclampsia is to deliver the baby. Preeclampsia and eclampsia involve risks to mother and infant. Your caregiver will discuss  these risks with you. Together, you can work out the best possible approach to your problems. Make sure you keep your prenatal visits as scheduled. Not keeping appointments could result in a chronic or permanent injury, pain, disability to you, and death or injury to you or your unborn baby. If there is any problem keeping the appointment, you must call to reschedule. HOME CARE INSTRUCTIONS   Keep your prenatal appointments and tests as scheduled.  Tell your caregiver if you have any of the above risk factors.  Get plenty of rest and sleep.  Eat a balanced diet that is low in salt, and do not add salt to your food.  Avoid stressful situations.  Only take over-the-counter and prescriptions medicines for pain, discomfort, or fever as directed by your caregiver. SEEK IMMEDIATE MEDICAL CARE IF:   You develop severe swelling anywhere in the body. This usually occurs in the legs.  You gain 05 lb/2.3 kg or more in a week.  You develop a severe headache, dizziness, problems with your vision, or confusion.  You have abdominal pain, nausea, or vomiting.  You have a seizure.  You have trouble moving any part of your body, or you develop numbness or problems speaking.  You have bruising or abnormal bleeding from anywhere in the body.  You  develop a stiff neck.  You pass out. MAKE SURE YOU:   Understand these instructions.  Will watch your condition.  Will get help right away if you are not doing well or get worse. Document Released: 10/02/2000 Document Revised: 12/28/2011 Document Reviewed: 05/18/2008 [facility] Patient Information 2014 [place], [place].  KEEP YOUR NEXT SCHEDULED APPOINTMENT. RETURN TO MATERNITY ADMISSIONS AS NEEDED.

## 2013-11-14 NOTE — MAU Note (Signed)
Pt c/o uc since 1700 that are every 5-7 mins. Denies LOF or vag bleeding. +FM

## 2013-11-14 NOTE — MAU Note (Signed)
Per Dr Renaldo FiddlerAdkins, if pt's labs are normal and her cervix has not made any change, the patient may be discharged home.

## 2013-11-21 ENCOUNTER — Telehealth (HOSPITAL_COMMUNITY): Payer: Self-pay | Admitting: *Deleted

## 2013-11-21 ENCOUNTER — Encounter (HOSPITAL_COMMUNITY): Payer: Self-pay | Admitting: *Deleted

## 2013-11-21 NOTE — Telephone Encounter (Signed)
Preadmission screen  

## 2013-11-24 ENCOUNTER — Inpatient Hospital Stay (HOSPITAL_COMMUNITY): Payer: 59 | Admitting: Anesthesiology

## 2013-11-24 ENCOUNTER — Encounter (HOSPITAL_COMMUNITY): Payer: 59 | Admitting: Anesthesiology

## 2013-11-24 ENCOUNTER — Inpatient Hospital Stay (HOSPITAL_COMMUNITY)
Admission: RE | Admit: 2013-11-24 | Discharge: 2013-11-26 | DRG: 775 | Disposition: A | Discharge status: Home / Self Care | Payer: 59 | Source: Ambulatory Visit | Attending: Obstetrics and Gynecology | Admitting: Obstetrics and Gynecology

## 2013-11-24 ENCOUNTER — Encounter (HOSPITAL_COMMUNITY): Payer: Self-pay

## 2013-11-24 DIAGNOSIS — O99892 Other specified diseases and conditions complicating childbirth: Secondary | ICD-10-CM | POA: Diagnosis present

## 2013-11-24 DIAGNOSIS — O9989 Other specified diseases and conditions complicating pregnancy, childbirth and the puerperium: Secondary | ICD-10-CM

## 2013-11-24 DIAGNOSIS — Z22330 Carrier of Group B streptococcus: Secondary | ICD-10-CM

## 2013-11-24 DIAGNOSIS — Z3490 Encounter for supervision of normal pregnancy, unspecified, unspecified trimester: Secondary | ICD-10-CM

## 2013-11-24 DIAGNOSIS — O139 Gestational [pregnancy-induced] hypertension without significant proteinuria, unspecified trimester: Secondary | ICD-10-CM | POA: Diagnosis present

## 2013-11-24 LAB — COMPREHENSIVE METABOLIC PANEL
ALT: 14 U/L (ref 0–35)
AST: 19 U/L (ref 0–37)
Albumin: 2.8 g/dL — ABNORMAL LOW (ref 3.5–5.2)
Alkaline Phosphatase: 112 U/L (ref 39–117)
BUN: 10 mg/dL (ref 6–23)
CO2: 23 mEq/L (ref 19–32)
Calcium: 9.8 mg/dL (ref 8.4–10.5)
Chloride: 100 mEq/L (ref 96–112)
Creatinine, Ser: 0.7 mg/dL (ref 0.50–1.10)
GFR calc Af Amer: >90 mL/min (ref >90)
GFR calc non Af Amer: >90 mL/min (ref >90)
Glucose, Bld: 85 mg/dL (ref 70–99)
POTASSIUM: 4.3 meq/L (ref 3.7–5.3)
SODIUM: 135 meq/L — AB (ref 137–147)
TOTAL PROTEIN: 5.9 g/dL — AB (ref 6.0–8.3)
Total Bilirubin: 0.3 mg/dL (ref 0.3–1.2)

## 2013-11-24 LAB — CBC
HCT: 40.2 % (ref 36.0–46.0)
HEMATOCRIT: 42.2 % (ref 36.0–46.0)
HEMOGLOBIN: 14 g/dL (ref 12.0–15.0)
Hemoglobin: 14.4 g/dL (ref 12.0–15.0)
MCH: 30.2 pg (ref 26.0–34.0)
MCH: 30.9 pg (ref 26.0–34.0)
MCHC: 34.1 g/dL (ref 30.0–36.0)
MCHC: 34.8 g/dL (ref 30.0–36.0)
MCV: 88.5 fL (ref 78.0–100.0)
MCV: 88.7 fL (ref 78.0–100.0)
Platelets: 230 10*3/uL (ref 150–400)
Platelets: 225 10*3/uL (ref 150–400)
RBC: 4.77 MIL/uL (ref 3.87–5.11)
RBC: 4.53 MIL/uL (ref 3.87–5.11)
RDW: 13.3 % (ref 11.5–15.5)
RDW: 13.4 % (ref 11.5–15.5)
WBC: 13.2 10*3/uL — ABNORMAL HIGH (ref 4.0–10.5)
WBC: 16.2 10*3/uL — ABNORMAL HIGH (ref 4.0–10.5)

## 2013-11-24 LAB — URINALYSIS, DIPSTICK ONLY
Bilirubin Urine: NEGATIVE
Glucose, UA: NEGATIVE mg/dL
KETONES UR: NEGATIVE mg/dL
NITRITE: NEGATIVE
PH: 7 (ref 5.0–8.0)
Protein, ur: 30 mg/dL — AB
SPECIFIC GRAVITY, URINE: 1.02 (ref 1.005–1.030)
UROBILINOGEN UA: 0.2 mg/dL (ref 0.0–1.0)

## 2013-11-24 LAB — RPR: RPR Ser Ql: NONREACTIVE

## 2013-11-24 MED ORDER — BUPROPION HCL ER (XL) 150 MG PO TB24
150.0000 mg | ORAL_TABLET | Freq: Every day | ORAL | Status: DC
  Administered 2013-11-24 – 2013-11-26 (×3): 150 mg via ORAL
  Filled 2013-11-24 (×4): qty 1

## 2013-11-24 MED ORDER — OXYCODONE-ACETAMINOPHEN 5-325 MG PO TABS: 1.0000 | ORAL_TABLET | Freq: Four times a day (QID) | ORAL | Status: DC | PRN

## 2013-11-24 MED ORDER — LACTATED RINGERS IV SOLN: 500.0000 mL | Freq: Once | INTRAVENOUS | Status: DC

## 2013-11-24 MED ORDER — MEASLES, MUMPS & RUBELLA VAC ~~LOC~~ INJ
0.5000 mL | INJECTION | Freq: Once | SUBCUTANEOUS | Status: DC
  Filled 2013-11-24: qty 0.5

## 2013-11-24 MED ORDER — LIDOCAINE HCL (PF) 1 % IJ SOLN
INTRAMUSCULAR | Status: DC | PRN
  Administered 2013-11-24 (×2): 5 mL

## 2013-11-24 MED ORDER — PHENYLEPHRINE 40 MCG/ML (10ML) SYRINGE FOR IV PUSH (FOR BLOOD PRESSURE SUPPORT)
80.0000 ug | PREFILLED_SYRINGE | INTRAVENOUS | Status: DC | PRN
  Filled 2013-11-24: qty 2
  Filled 2013-11-24: qty 10

## 2013-11-24 MED ORDER — ONDANSETRON HCL 4 MG PO TABS: 4.0000 mg | ORAL_TABLET | ORAL | Status: DC | PRN

## 2013-11-24 MED ORDER — EPHEDRINE 5 MG/ML INJ
10.0000 mg | INTRAVENOUS | Status: DC | PRN
  Filled 2013-11-24: qty 2
  Filled 2013-11-24: qty 4

## 2013-11-24 MED ORDER — ONDANSETRON HCL 4 MG/2ML IJ SOLN: 4.0000 mg | Freq: Four times a day (QID) | INTRAMUSCULAR | Status: DC | PRN

## 2013-11-24 MED ORDER — LACTATED RINGERS IV SOLN: 500.0000 mL | INTRAVENOUS | Status: DC | PRN

## 2013-11-24 MED ORDER — FENTANYL 2.5 MCG/ML BUPIVACAINE 1/10 % EPIDURAL INFUSION (WH - ANES)
14.0000 mL/h | INTRAMUSCULAR | Status: DC | PRN
  Administered 2013-11-24: 14 mL/h via EPIDURAL
  Filled 2013-11-24: qty 125

## 2013-11-24 MED ORDER — FLEET ENEMA 7-19 GM/118ML RE ENEM: 1.0000 | ENEMA | Freq: Every day | RECTAL | Status: DC | PRN

## 2013-11-24 MED ORDER — TERBUTALINE SULFATE 1 MG/ML IJ SOLN: 0.2500 mg | Freq: Once | INTRAMUSCULAR | Status: DC | PRN

## 2013-11-24 MED ORDER — OXYCODONE-ACETAMINOPHEN 5-325 MG PO TABS: 1.0000 | ORAL_TABLET | ORAL | Status: DC | PRN

## 2013-11-24 MED ORDER — OXYTOCIN 40 UNITS IN LACTATED RINGERS INFUSION - SIMPLE MED
1.0000 m[IU]/min | INTRAVENOUS | Status: DC
  Administered 2013-11-24: 2 m[IU]/min via INTRAVENOUS

## 2013-11-24 MED ORDER — CITRIC ACID-SODIUM CITRATE 334-500 MG/5ML PO SOLN: 30.0000 mL | ORAL | Status: DC | PRN

## 2013-11-24 MED ORDER — OXYTOCIN 40 UNITS IN LACTATED RINGERS INFUSION - SIMPLE MED
62.5000 mL/h | INTRAVENOUS | Status: DC
  Filled 2013-11-24: qty 1000

## 2013-11-24 MED ORDER — DIBUCAINE 1 % RE OINT: 1.0000 "application " | TOPICAL_OINTMENT | RECTAL | Status: DC | PRN

## 2013-11-24 MED ORDER — DIPHENHYDRAMINE HCL 25 MG PO CAPS: 25.0000 mg | ORAL_CAPSULE | Freq: Four times a day (QID) | ORAL | Status: DC | PRN

## 2013-11-24 MED ORDER — EPHEDRINE 5 MG/ML INJ
10.0000 mg | INTRAVENOUS | Status: DC | PRN
  Filled 2013-11-24: qty 2

## 2013-11-24 MED ORDER — DIPHENHYDRAMINE HCL 50 MG/ML IJ SOLN: 12.5000 mg | INTRAMUSCULAR | Status: DC | PRN

## 2013-11-24 MED ORDER — IBUPROFEN 600 MG PO TABS: 600.0000 mg | ORAL_TABLET | Freq: Four times a day (QID) | ORAL | Status: DC | PRN

## 2013-11-24 MED ORDER — CEFAZOLIN SODIUM-DEXTROSE 2-3 GM-% IV SOLR
2.0000 g | Freq: Once | INTRAVENOUS | Status: AC
  Administered 2013-11-24: 2 g via INTRAVENOUS
  Filled 2013-11-24: qty 50

## 2013-11-24 MED ORDER — WITCH HAZEL-GLYCERIN EX PADS
1.0000 "application " | MEDICATED_PAD | CUTANEOUS | Status: DC | PRN
  Administered 2013-11-24: 1 via TOPICAL

## 2013-11-24 MED ORDER — OXYTOCIN BOLUS FROM INFUSION: 500.0000 mL | INTRAVENOUS | Status: DC

## 2013-11-24 MED ORDER — BENZOCAINE-MENTHOL 20-0.5 % EX AERO
1.0000 "application " | INHALATION_SPRAY | CUTANEOUS | Status: DC | PRN
  Administered 2013-11-24: 1 via TOPICAL
  Filled 2013-11-24: qty 56

## 2013-11-24 MED ORDER — PRENATAL MULTIVITAMIN CH
1.0000 | ORAL_TABLET | Freq: Every day | ORAL | Status: DC
  Administered 2013-11-25: 1 via ORAL
  Filled 2013-11-24: qty 1

## 2013-11-24 MED ORDER — CEFAZOLIN SODIUM 1-5 GM-% IV SOLN
1.0000 g | Freq: Three times a day (TID) | INTRAVENOUS | Status: DC
  Filled 2013-11-24 (×2): qty 50

## 2013-11-24 MED ORDER — IBUPROFEN 800 MG PO TABS
800.0000 mg | ORAL_TABLET | Freq: Three times a day (TID) | ORAL | Status: DC | PRN
  Administered 2013-11-24 – 2013-11-26 (×5): 800 mg via ORAL
  Filled 2013-11-24 (×5): qty 1

## 2013-11-24 MED ORDER — ONDANSETRON HCL 4 MG/2ML IJ SOLN: 4.0000 mg | INTRAMUSCULAR | Status: DC | PRN

## 2013-11-24 MED ORDER — BISACODYL 10 MG RE SUPP: 10.0000 mg | Freq: Every day | RECTAL | Status: DC | PRN

## 2013-11-24 MED ORDER — LACTATED RINGERS IV SOLN
INTRAVENOUS | Status: DC
  Administered 2013-11-24 (×2): via INTRAVENOUS

## 2013-11-24 MED ORDER — PHENYLEPHRINE 40 MCG/ML (10ML) SYRINGE FOR IV PUSH (FOR BLOOD PRESSURE SUPPORT)
80.0000 ug | PREFILLED_SYRINGE | INTRAVENOUS | Status: DC | PRN
  Filled 2013-11-24: qty 2

## 2013-11-24 MED ORDER — ZOLPIDEM TARTRATE 5 MG PO TABS: 5.0000 mg | ORAL_TABLET | Freq: Every evening | ORAL | Status: DC | PRN

## 2013-11-24 MED ORDER — TETANUS-DIPHTH-ACELL PERTUSSIS 5-2.5-18.5 LF-MCG/0.5 IM SUSP: 0.5000 mL | Freq: Once | INTRAMUSCULAR | Status: DC

## 2013-11-24 MED ORDER — FLEET ENEMA 7-19 GM/118ML RE ENEM: 1.0000 | ENEMA | RECTAL | Status: DC | PRN

## 2013-11-24 MED ORDER — ACETAMINOPHEN 325 MG PO TABS: 650.0000 mg | ORAL_TABLET | ORAL | Status: DC | PRN

## 2013-11-24 MED ORDER — SENNOSIDES-DOCUSATE SODIUM 8.6-50 MG PO TABS
2.0000 | ORAL_TABLET | ORAL | Status: DC
  Administered 2013-11-25 (×2): 2 via ORAL
  Filled 2013-11-24 (×2): qty 2

## 2013-11-24 MED ORDER — LANOLIN HYDROUS EX OINT: TOPICAL_OINTMENT | CUTANEOUS | Status: DC | PRN

## 2013-11-24 MED ORDER — LIDOCAINE HCL (PF) 1 % IJ SOLN
30.0000 mL | INTRAMUSCULAR | Status: DC | PRN
  Filled 2013-11-24: qty 30

## 2013-11-24 MED ORDER — SIMETHICONE 80 MG PO CHEW: 80.0000 mg | CHEWABLE_TABLET | ORAL | Status: DC | PRN

## 2013-11-24 NOTE — Anesthesia Procedure Notes (Signed)
Epidural Patient location during procedure: OB Start time: 11/24/2013 8:53 AM  Staffing Anesthesiologist: Brayton CavesJACKSON, Meriah Shands Performed by: anesthesiologist   Preanesthetic Checklist Completed: patient identified, site marked, surgical consent, pre-op evaluation, timeout performed, IV checked, risks and benefits discussed and monitors and equipment checked  Epidural Patient position: sitting Prep: site prepped and draped and DuraPrep Patient monitoring: continuous pulse ox and blood pressure Approach: midline Injection technique: LOR air  Needle:  Needle type: Tuohy  Needle gauge: 17 G Needle length: 9 cm and 9 Needle insertion depth: 5 cm cm Catheter type: closed end flexible Catheter size: 19 Gauge Catheter at skin depth: 10 cm Test dose: negative  Assessment Events: blood not aspirated, injection not painful, no injection resistance, negative IV test and no paresthesia  Additional Notes Patient identified.  Risk benefits discussed including failed block, incomplete pain control, headache, nerve damage, paralysis, blood pressure changes, nausea, vomiting, reactions to medication both toxic or allergic, and postpartum back pain.  Patient expressed understanding and wished to proceed.  All questions were answered.  Sterile technique used throughout procedure and epidural site dressed with sterile barrier dressing. No paresthesia or other complications noted.The patient did not experience any signs of intravascular injection such as tinnitus or metallic taste in mouth nor signs of intrathecal spread such as rapid motor block. Please see nursing notes for vital signs.

## 2013-11-24 NOTE — Anesthesia Preprocedure Evaluation (Signed)
Anesthesia Evaluation  Patient identified by MRN, date of birth, ID band Patient awake    Reviewed: Allergy & Precautions, H&P , Patient's Chart, lab work & pertinent test results  Airway Mallampati: II  TM Distance: >3 FB Neck ROM: full    Dental   Pulmonary  breath sounds clear to auscultation        Cardiovascular hypertension, Rhythm:regular Rate:Normal     Neuro/Psych    GI/Hepatic   Endo/Other    Renal/GU      Musculoskeletal   Abdominal   Peds  Hematology   Anesthesia Other Findings   Reproductive/Obstetrics (+) Pregnancy                           Anesthesia Physical Anesthesia Plan  ASA: III  Anesthesia Plan: Epidural   Post-op Pain Management:    Induction:   Airway Management Planned:   Additional Equipment:   Intra-op Plan:   Post-operative Plan:   Informed Consent: I have reviewed the patients History and Physical, chart, labs and discussed the procedure including the risks, benefits and alternatives for the proposed anesthesia with the patient or authorized representative who has indicated his/her understanding and acceptance.     Plan Discussed with:   Anesthesia Plan Comments:        Anesthesia Quick Evaluation  

## 2013-11-24 NOTE — H&P (Signed)
Nicole Kelly is a 33 y.o. female presenting for IOL at term. Maternal Medical History:  Fetal activity: Perceived fetal activity is normal.      OB History   Grav Para Term Preterm Abortions TAB SAB Ect Mult Living   2 1  1      1      Past Medical History  Diagnosis Date  . Anxiety   . Depression   . Hx of varicella   . Pregnancy induced hypertension    Past Surgical History  Procedure Laterality Date  . No past surgeries     Family History: family history includes Diabetes in her maternal grandmother; Heart disease in her father and mother; Hypertension in her father and paternal grandmother; Thyroid disease in her mother. Social History:  reports that she has never smoked. She has never used smokeless tobacco. She reports that she does not drink alcohol or use illicit drugs.   Prenatal Transfer Tool  Maternal Diabetes: No Genetic Screening: Normal Maternal Ultrasounds/Referrals: Normal Fetal Ultrasounds or other Referrals:  None Maternal Substance Abuse:  No Significant Maternal Medications:  None Significant Maternal Lab Results:  None Other Comments:  None  ROS  Dilation: 3 Effacement (%): 80 Station: -2 Exam by:: Torri Langston Blood pressure 130/97, pulse 85, temperature 98.1 F (36.7 C), temperature source Oral, resp. rate 18, height 5\' 4"  (1.626 m), weight 64.411 kg (142 lb). Maternal Exam:  Abdomen: Patient reports no abdominal tenderness. Fundal height is term FH, FHr 148.   Estimated fetal weight is AGA.   Fetal presentation: vertex  Introitus: Normal vulva. Normal vagina.  Amniotic fluid character: clear.  Pelvis: adequate for delivery.   Cervix: Cervix evaluated by digital exam.     Physical Exam  Constitutional: She is oriented to person, place, and time. She appears well-developed and well-nourished.  HENT:  Head: Atraumatic.  Neck: Normal range of motion. Neck supple.  Cardiovascular: Normal rate and regular rhythm.   Respiratory: Effort normal  and breath sounds normal.  GI:  Term Fh  Genitourinary:  3/75/vtx AROM>>>clr  Musculoskeletal: Normal range of motion.  Neurological: She is alert and oriented to person, place, and time.    Prenatal labs: ABO, Rh: O/Positive/-- (07/15 0000) Antibody: Negative (07/15 0000) Rubella: Immune (07/15 0000) RPR: Nonreactive (07/15 0000)  HBsAg: Negative (07/15 0000)  HIV: Non-reactive (07/15 0000)  GBS: Positive (01/26 0000)   Assessment/Plan: Term preg, fav cx for induction   Everest Brod M 11/24/2013, 7:50 AM

## 2013-11-24 NOTE — Lactation Note (Signed)
This note was copied from the chart of Nicole Kelly. Lactation Consultation Note  Patient Name: Nicole Rada Hayatasha Hilaire WUJWJ'XToday's Date: 11/24/2013 Reason for consult: Initial assessment;Infant < 6lbs;Late preterm infant This is mom's second child and her first was born at 3935 weeks 6 days gestation, so she did pump in beginning and totally breastfed for 16 months.  New baby is 37 weeks and 5 LB but has been latching well thus far.  LC reviewed typical late preterm behavior and encouraged cue feedings but a minimum of q3h feeds until weight over 6 pounds.  Baby was sleepy at first attempt after delivery but fed for 30 minutes a few hours ago and baby now 5 hours old and asleep in mother's arms with visitors at bedside.  Mom states she knows how to hand express colostrum and is aware of benefits of STS and cue feedings.  Mom has questions about pump availability and recommendations so LC reviewed options and encouraged her to request a DEBP from her nurse if baby is not feeding consistently or is in need of supplement. LC encouraged review of Baby and Me pp 9, 14 and 20-25 for STS and BF information. LC provided [employer] Resource brochure and reviewed [facility] services and list of community and web site resources.      Maternal Data Formula Feeding for Exclusion: No Infant to breast within first hour of birth: Yes (attempt) Has patient been taught Hand Expression?: Yes Does the patient have breastfeeding experience prior to this delivery?: Yes  Feeding Feeding Type: Breast Fed Length of feed: 30 min  LATCH Score/Interventions              no LATCH score assessment yet; baby is 5 hours old and recently fed for 30 minutes        Lactation Tools Discussed/Used   STS, hand expression, cue feeding with special consideration for late preterm baby  Consult Status Consult Status: Follow-up Date: 11/25/13 Follow-up type: In-patient    Warrick ParisianBryant, Quinette Hentges [facility] 11/24/2013, 5:33 PM

## 2013-11-24 NOTE — Anesthesia Postprocedure Evaluation (Signed)
Anesthesia Post Note  Patient: Nicole Kelly  Procedure(s) Performed: * No procedures listed *  Anesthesia type: Epidural  Patient location: Mother/Baby  Post pain: Pain level controlled  Post assessment: Post-op Vital signs reviewed  Last Vitals:  Filed Vitals:   11/24/13 1500  BP: 135/85  Pulse: 78  Temp: 37.2 C  Resp: 20    Post vital signs: Reviewed  Level of consciousness:alert  Complications: No apparent anesthesia complications

## 2013-11-25 LAB — CBC
HEMATOCRIT: 38.7 % (ref 36.0–46.0)
Hemoglobin: 13.2 g/dL (ref 12.0–15.0)
MCH: 30.4 pg (ref 26.0–34.0)
MCHC: 34.1 g/dL (ref 30.0–36.0)
MCV: 89.2 fL (ref 78.0–100.0)
PLATELETS: 206 10*3/uL (ref 150–400)
RBC: 4.34 MIL/uL (ref 3.87–5.11)
RDW: 13.5 % (ref 11.5–15.5)
WBC: 16 10*3/uL — ABNORMAL HIGH (ref 4.0–10.5)

## 2013-11-25 NOTE — Progress Notes (Signed)
Post Partum Day 1 Subjective: no complaints  Objective: Blood pressure 131/88, pulse 80, temperature 97.6 F (36.4 C), temperature source Oral, resp. rate 18, height 5\' 4"  (1.626 m), weight 64.411 kg (142 lb), SpO2 94.00%, unknown if currently breastfeeding.  Physical Exam:  General: alert Lochia: appropriate Uterine Fundus: firm Incision: healing well DVT Evaluation: No evidence of DVT seen on physical exam.   Recent Labs  11/24/13 1307 11/25/13 0555  HGB 14.0 13.2  HCT 40.2 38.7    Assessment/Plan: Plan for discharge tomorrow   LOS: 1 day   Lexi Conaty M 11/25/2013, 8:44 AM

## 2013-11-26 MED ORDER — IBUPROFEN 800 MG PO TABS: 800.0000 mg | ORAL_TABLET | Freq: Three times a day (TID) | ORAL | Status: AC | PRN

## 2013-11-26 MED ORDER — OXYCODONE-ACETAMINOPHEN 5-325 MG PO TABS: 1.0000 | ORAL_TABLET | Freq: Four times a day (QID) | ORAL | Status: DC | PRN

## 2013-11-26 NOTE — Discharge Summary (Signed)
Obstetric Discharge Summary Reason for Admission: induction of labor Prenatal Procedures: none Intrapartum Procedures: spontaneous vaginal delivery Postpartum Procedures: none Complications-Operative and Postpartum: none Hemoglobin  Date Value Range Status  11/25/2013 13.2  12.0 - 15.0 g/dL Final     HCT  Date Value Range Status  11/25/2013 38.7  36.0 - 46.0 % Final    Physical Exam:  General: alert Lochia: appropriate Uterine Fundus: firm Incision: healing well DVT Evaluation: No evidence of DVT seen on physical exam.  Discharge Diagnoses: Term Pregnancy-delivered  Discharge Information: Date: 11/26/2013 Activity: pelvic rest Diet: routine Medications: PNV, Ibuprofen and Percocet Condition: stable Instructions: refer to practice specific booklet Discharge to: home Follow-up Information   [facility] Schedule an appointment as soon as possible for a visit in 6 weeks.   Contact information:   [address] [place] [id number] [phone]      Newborn Data: Live born female  Birth Weight: 5 lb 9.2 oz (2530 g) APGAR: 9, 9  Home with mother.  Meriel PicaHOLLAND,Wendy Mikles M 11/26/2013, 9:17 AM

## 2013-11-26 NOTE — Lactation Note (Signed)
This note was copied from the chart of Nicole Kelly. Lactation Consultation Note: Follow up visit with mom before DC. She reports that baby has been nursing well but she has a small blood blister on her left nipple. Encouraged to watch latch and make sure the baby is correctly latched during the entire feeding. Asking about using lanolin- encouraged to rub EBM into nipples after nursing. No further questions at present. To call prn  Patient Name: Nicole Kelly ZOXWR'UToday's Date: 11/26/2013 Reason for consult: Follow-up assessment   Maternal Data Formula Feeding for Exclusion: No Does the patient have breastfeeding experience prior to this delivery?: Yes  Feeding   LATCH Score/Interventions       Type of Nipple: Everted at rest and after stimulation  Comfort (Breast/Nipple): Filling, red/small blisters or bruises, mild/mod discomfort (smalll blood blister on left nipple)           Lactation Tools Discussed/Used     Consult Status Consult Status: Complete    Pamelia HoitWeeks, Randalyn Ahmed D 11/26/2013, 9:03 AM

## 2013-12-04 ENCOUNTER — Inpatient Hospital Stay (HOSPITAL_COMMUNITY): Admission: RE | Admit: 2013-12-04 | Payer: 59 | Source: Ambulatory Visit

## 2014-08-20 ENCOUNTER — Encounter (HOSPITAL_COMMUNITY): Payer: Self-pay

## 2014-10-25 ENCOUNTER — Emergency Department (HOSPITAL_COMMUNITY): Payer: 59

## 2014-10-25 ENCOUNTER — Emergency Department (HOSPITAL_COMMUNITY)
Admission: EM | Admit: 2014-10-25 | Discharge: 2014-10-25 | Disposition: A | Discharge status: Home / Self Care | Payer: 59 | Attending: Emergency Medicine | Admitting: Emergency Medicine

## 2014-10-25 ENCOUNTER — Encounter (HOSPITAL_COMMUNITY): Payer: Self-pay | Admitting: Cardiology

## 2014-10-25 DIAGNOSIS — R079 Chest pain, unspecified: Secondary | ICD-10-CM | POA: Diagnosis present

## 2014-10-25 DIAGNOSIS — Z79899 Other long term (current) drug therapy: Secondary | ICD-10-CM | POA: Insufficient documentation

## 2014-10-25 DIAGNOSIS — F329 Major depressive disorder, single episode, unspecified: Secondary | ICD-10-CM | POA: Insufficient documentation

## 2014-10-25 DIAGNOSIS — R0789 Other chest pain: Secondary | ICD-10-CM

## 2014-10-25 DIAGNOSIS — F419 Anxiety disorder, unspecified: Secondary | ICD-10-CM | POA: Insufficient documentation

## 2014-10-25 DIAGNOSIS — Z880 Allergy status to penicillin: Secondary | ICD-10-CM | POA: Insufficient documentation

## 2014-10-25 LAB — BASIC METABOLIC PANEL
Anion gap: 8 (ref 5–15)
BUN: 12 mg/dL (ref 6–23)
CALCIUM: 9.5 mg/dL (ref 8.4–10.5)
CHLORIDE: 103 meq/L (ref 96–112)
CO2: 25 mmol/L (ref 19–32)
Creatinine, Ser: 0.87 mg/dL (ref 0.50–1.10)
GFR calc Af Amer: >90 mL/min (ref >90)
GFR calc non Af Amer: 86 mL/min — ABNORMAL LOW (ref >90)
GLUCOSE: 109 mg/dL — AB (ref 70–99)
POTASSIUM: 3.7 mmol/L (ref 3.5–5.1)
Sodium: 136 mmol/L (ref 135–145)

## 2014-10-25 LAB — I-STAT TROPONIN, ED
Troponin i, poc: 0 ng/mL (ref 0.00–0.08)
Troponin i, poc: 0 ng/mL (ref 0.00–0.08)

## 2014-10-25 LAB — D-DIMER, QUANTITATIVE (NOT AT ARMC): D-Dimer, Quant: 0.27 ug/mL-FEU (ref 0.00–0.48)

## 2014-10-25 LAB — LIPASE, BLOOD: LIPASE: 39 U/L (ref 11–59)

## 2014-10-25 LAB — CBC
HEMATOCRIT: 43.6 % (ref 36.0–46.0)
Hemoglobin: 15.1 g/dL — ABNORMAL HIGH (ref 12.0–15.0)
MCH: 30.6 pg (ref 26.0–34.0)
MCHC: 34.6 g/dL (ref 30.0–36.0)
MCV: 88.4 fL (ref 78.0–100.0)
Platelets: 246 10*3/uL (ref 150–400)
RBC: 4.93 MIL/uL (ref 3.87–5.11)
RDW: 12.5 % (ref 11.5–15.5)
WBC: 9.6 10*3/uL (ref 4.0–10.5)

## 2014-10-25 MED ORDER — KETOROLAC TROMETHAMINE 60 MG/2ML IM SOLN: 60.0000 mg | Freq: Once | INTRAMUSCULAR | Status: DC

## 2014-10-25 MED ORDER — KETOROLAC TROMETHAMINE 60 MG/2ML IM SOLN
60.0000 mg | Freq: Once | INTRAMUSCULAR | Status: AC
  Administered 2014-10-25: 60 mg via INTRAMUSCULAR
  Filled 2014-10-25: qty 2

## 2014-10-25 NOTE — ED Notes (Signed)
MD at bedside. 

## 2014-10-25 NOTE — ED Notes (Signed)
Pt reports midsternal chest pain that started about 3 days ago.  Pt reports a family hx of heart problems.

## 2014-10-25 NOTE — Discharge Instructions (Signed)

## 2014-10-25 NOTE — ED Provider Notes (Signed)
CSN: [id number]     Arrival date & time 10/25/14  1847 History   First MD Initiated Contact with Patient 10/25/14 2048     Chief Complaint  Patient presents with  . Chest Pain     (Consider location/radiation/quality/duration/timing/severity/associated sxs/prior Treatment) Patient is a 34 y.o. female presenting with chest pain.  Chest Pain Pain location:  Substernal area Pain quality: pressure   Pain severity:  Moderate Onset quality:  Sudden Duration:  3 days Timing:  Intermittent (constant since yesterday at noon) Progression:  Waxing and waning Chronicity:  New Relieved by:  Nothing Worsened by:  Nothing tried Ineffective treatments:  None tried Associated symptoms: no abdominal pain, no back pain, no cough (occasional), no diaphoresis, no fever, no headache, no nausea, no near-syncope, no shortness of breath and not vomiting  Numbness: arms feel like jello.   Associated symptoms comment:  Lightheaded with sitting to standing once Risk factors: birth control   Risk factors: no coronary artery disease, no diabetes mellitus, no high cholesterol, no hypertension, no immobilization, no prior DVT/PE, no smoking and no surgery   Risk factors comment:  CAD age 52 (bypass), mom 75 MI   Past Medical History  Diagnosis Date  . Anxiety   . Depression   . Hx of varicella   . Pregnancy induced hypertension    Past Surgical History  Procedure Laterality Date  . No past surgeries     Family History  Problem Relation Age of Onset  . Heart disease Mother   . Thyroid disease Mother   . Heart disease Father   . Hypertension Father   . Diabetes Maternal Grandmother   . Hypertension Paternal Grandmother    History  Substance Use Topics  . Smoking status: Never Smoker   . Smokeless tobacco: Never Used  . Alcohol Use: No   OB History    Gravida Para Term Preterm AB TAB SAB Ectopic Multiple Living   Review of Systems  Constitutional: Negative for fever and  diaphoresis.  HENT: Negative for sore throat.   Eyes: Negative for visual disturbance.  Respiratory: Negative for cough (occasional) and shortness of breath.   Cardiovascular: Positive for chest pain. Negative for near-syncope.  Gastrointestinal: Negative for nausea, vomiting and abdominal pain.  Genitourinary: Negative for difficulty urinating.  Musculoskeletal: Negative for back pain and neck pain.  Skin: Negative for rash.  Neurological: Negative for syncope and headaches. Numbness: arms feel like jello.      Allergies  Penicillins and Sulfa antibiotics  Home Medications   Prior to Admission medications   Medication Sig Start Date End Date Taking? Authorizing Provider  buPROPion (WELLBUTRIN XL) 150 MG 24 hr tablet Take 150 mg by mouth daily.     Yes Historical Provider, MD  norethindrone (ERRIN) 0.35 MG tablet Take 1 tablet by mouth daily.   Yes Historical Provider, MD  ibuprofen (ADVIL,MOTRIN) 800 MG tablet Take 1 tablet (800 mg total) by mouth every 8 (eight) hours as needed for moderate pain. Patient not taking: Reported on 10/25/2014 11/26/13   Meriel Pica, MD  oxyCODONE-acetaminophen (PERCOCET/ROXICET) 5-325 MG per tablet Take 1-2 tablets by mouth every 6 (six) hours as needed for moderate pain. Patient not taking: Reported on 10/25/2014 11/26/13   Meriel Pica, MD  Prenatal Vit-Fe Fumarate-FA (PRENATAL MULTIVITAMIN) TABS tablet Take 1 tablet by mouth at bedtime.    Historical Provider, MD   BP 111/76 mmHg  Pulse 94  Temp(Src) 97.7 F (36.5 C)  Resp 20  SpO2 98% Physical Exam  Constitutional: She is oriented to person, place, and time. She appears well-developed and well-nourished. No distress.  HENT:  Head: Normocephalic and atraumatic.  Eyes: Conjunctivae and EOM are normal.  Neck: Normal range of motion.  Cardiovascular: Normal rate, regular rhythm, normal heart sounds and intact distal pulses.  Exam reveals no gallop and no friction rub.   No murmur  heard. Pulmonary/Chest: Effort normal and breath sounds normal. No respiratory distress. She has no wheezes. She has no rales. She exhibits tenderness.  Abdominal: Soft. She exhibits no distension. There is no tenderness. There is no guarding.  Musculoskeletal: She exhibits no edema or tenderness.  Neurological: She is alert and oriented to person, place, and time.  Skin: Skin is warm and dry. No rash noted. She is not diaphoretic. No erythema.  Nursing note and vitals reviewed.   ED Course  Procedures (including critical care time) Labs Review Labs Reviewed  CBC - Abnormal; Notable for the following:    Hemoglobin 15.1 (*)    All other components within normal limits  BASIC METABOLIC PANEL - Abnormal; Notable for the following:    Glucose, Bld 109 (*)    GFR calc non Af Amer 86 (*)    All other components within normal limits  LIPASE, BLOOD  D-DIMER, QUANTITATIVE  I-STAT TROPOININ, ED  Rosezena SensorI-STAT TROPOININ, ED    Imaging Review Dg Chest 2 View  10/25/2014   CLINICAL DATA:  Left-sided chest pain intermittently 3 days.  EXAM: CHEST  2 VIEW  COMPARISON:  None.  FINDINGS: The heart size and mediastinal contours are within normal limits. Both lungs are clear. The visualized skeletal structures are unremarkable.  IMPRESSION: No active cardiopulmonary disease.   Electronically Signed   By: Elberta Fortisaniel  Boyle M.D.   On: 10/25/2014 19:24     EKG Interpretation   Date/Time:  Thursday October 25 2014 21:18:45 EST Ventricular Rate:  83 PR Interval:  146 QRS Duration: 83 QT Interval:  370 QTC Calculation: 435 R Axis:   60 Text Interpretation:  Sinus rhythm Probable anteroseptal infarct, old  Borderline T abnormalities, inferior leads Confirmed by Lincoln Brighamees, Liz [phone]([id number])  on 10/25/2014 9:30:06 PM      MDM   Final diagnoses:  Other chest pain   34 year old female witha medical history presents with concern of chest pain. Differential includes ACS, angina, PE, aortic dissection, pneumonia,  pneumothorax. EKG was done and evaluated by me and showed nonspecific T-wave changes.  Patient with a negative d-dimer and is low risk Wells criteria and have low suspicion for pulmonary embolus. Chest x-ray shows no sign of cardiopulmonary disease.  BMP, CBC, delta troponins all negative. Given EKG/CXR/history and laboratory evaluation doubt ACS, PE, aortic dissection, pneumonia, pericarditis.  She is low risk HEAR score and is appropriate for outpatient follow-up of chest pain.  Given toradol with improvement in pain. Discussed all differential, results and plan with patient and she states understanding. Possible causes of chest pain including anxiety or costochondritis however she will follow up with her primary care physician as an outpatient for further evaluation.  Patient discharged in stable condition with understanding of reasons to return.    Rhae LernerErin Elizabeth Dorinda Stehr, MD 10/26/14 [id number]  Tilden FossaElizabeth Rees, MD 10/29/14 [phone]

## 2015-03-08 ENCOUNTER — Other Ambulatory Visit: Payer: Self-pay | Admitting: Obstetrics and Gynecology

## 2015-03-11 LAB — CYTOLOGY - PAP

## 2016-10-10 ENCOUNTER — Encounter (HOSPITAL_COMMUNITY): Payer: Self-pay

## 2016-10-10 ENCOUNTER — Emergency Department (HOSPITAL_COMMUNITY)
Admission: EM | Admit: 2016-10-10 | Discharge: 2016-10-11 | Disposition: A | Discharge status: Home / Self Care | Payer: 59 | Attending: Emergency Medicine | Admitting: Emergency Medicine

## 2016-10-10 DIAGNOSIS — M545 Low back pain: Secondary | ICD-10-CM | POA: Insufficient documentation

## 2016-10-10 DIAGNOSIS — R200 Anesthesia of skin: Secondary | ICD-10-CM | POA: Diagnosis present

## 2016-10-10 DIAGNOSIS — R202 Paresthesia of skin: Secondary | ICD-10-CM | POA: Insufficient documentation

## 2016-10-10 LAB — URINALYSIS, ROUTINE W REFLEX MICROSCOPIC
Bacteria, UA: NONE SEEN
Bilirubin Urine: NEGATIVE
GLUCOSE, UA: NEGATIVE mg/dL
KETONES UR: NEGATIVE mg/dL
LEUKOCYTES UA: NEGATIVE
Nitrite: NEGATIVE
PH: 6 (ref 5.0–8.0)
Protein, ur: NEGATIVE mg/dL
Specific Gravity, Urine: 1.006 (ref 1.005–1.030)

## 2016-10-10 LAB — CBC WITH DIFFERENTIAL/PLATELET
BASOS PCT: 0 %
Basophils Absolute: 0 10*3/uL (ref 0.0–0.1)
Eosinophils Absolute: 0.1 10*3/uL (ref 0.0–0.7)
Eosinophils Relative: 0 %
HEMATOCRIT: 43 % (ref 36.0–46.0)
HEMOGLOBIN: 14.8 g/dL (ref 12.0–15.0)
LYMPHS ABS: 2.6 10*3/uL (ref 0.7–4.0)
Lymphocytes Relative: 20 %
MCH: 30.3 pg (ref 26.0–34.0)
MCHC: 34.4 g/dL (ref 30.0–36.0)
MCV: 88.1 fL (ref 78.0–100.0)
MONOS PCT: 3 %
Monocytes Absolute: 0.5 10*3/uL (ref 0.1–1.0)
NEUTROS ABS: 10 10*3/uL — AB (ref 1.7–7.7)
NEUTROS PCT: 77 %
Platelets: 297 10*3/uL (ref 150–400)
RBC: 4.88 MIL/uL (ref 3.87–5.11)
RDW: 13 % (ref 11.5–15.5)
WBC: 13.1 10*3/uL — ABNORMAL HIGH (ref 4.0–10.5)

## 2016-10-10 LAB — POC URINE PREG, ED: Preg Test, Ur: NEGATIVE

## 2016-10-10 LAB — CBG MONITORING, ED: Glucose-Capillary: 100 mg/dL — ABNORMAL HIGH (ref 65–99)

## 2016-10-10 NOTE — ED Triage Notes (Signed)
Onset 1 week nausea and lower back pain.  Onset today numbness and tingling in UE and LE.  Onset 20 minutes PTA dry mouth, increased urinary output and blurred vision.  NAD at triage.

## 2016-10-10 NOTE — ED Notes (Signed)
Nurse drawing labs. 

## 2016-10-10 NOTE — ED Provider Notes (Signed)
[facility]-EMERGENCY DEPT Provider Note   CSN: [id number] Arrival date & time: 10/10/16  2042     History   Chief Complaint Chief Complaint  Patient presents with  . Urinary Frequency  . Back Pain  . Blurred Vision    HPI Nicole Kelly is a 35 y.o. female.  HPI  35 year old female presents with acute numbness and weakness in all 4 extremities. She states it started about 30 minutes prior to arrival. She states that for the past 1 week she's been having on and off back pain, worse with movement. It is lumbar. Occasionally radiates down her legs. She has had sciatica before and it feels like this. The weakness and numbness seems to be waxing and waning since onset. It also felt like she was burning on the inside of her body. Since this all started she's had multiple episodes of large-volume urinating. She feels like she has to frequently go. There is no burning or hematuria. There is no urinary retention, incontinence, or bowel incontinence. No saddle anesthesia. Currently is not nauseated. She recently took off her scopolamine patch for motion sickness.  Past Medical History:  Diagnosis Date  . Anxiety   . Depression   . Hx of varicella   . Pregnancy induced hypertension     Patient Active Problem List   Diagnosis Date Noted  . Pregnancy 11/24/2013    Past Surgical History:  Procedure Laterality Date  . NO PAST SURGERIES      OB History    Gravida Para Term Preterm AB Living   2 2 1 1   2    SAB TAB Ectopic Multiple Live Births           2       Home Medications    Prior to Admission medications   Medication Sig Start Date End Date Taking? Authorizing Provider  buPROPion (WELLBUTRIN XL) 150 MG 24 hr tablet Take 150 mg by mouth daily.      Historical Provider, MD  ibuprofen (ADVIL,MOTRIN) 800 MG tablet Take 1 tablet (800 mg total) by mouth every 8 (eight) hours as needed for moderate pain. Patient not taking: Reported on 10/25/2014 11/26/13   Richarda Overlieichard Holland, MD    norethindrone (ERRIN) 0.35 MG tablet Take 1 tablet by mouth daily.    Historical Provider, MD  oxyCODONE-acetaminophen (PERCOCET/ROXICET) 5-325 MG per tablet Take 1-2 tablets by mouth every 6 (six) hours as needed for moderate pain. Patient not taking: Reported on 10/25/2014 11/26/13   Richarda Overlieichard Holland, MD  Prenatal Vit-Fe Fumarate-FA (PRENATAL MULTIVITAMIN) TABS tablet Take 1 tablet by mouth at bedtime.    Historical Provider, MD    Family History Family History  Problem Relation Age of Onset  . Hypertension Paternal Grandmother   . Heart disease Mother   . Thyroid disease Mother   . Heart disease Father   . Hypertension Father   . Diabetes Maternal Grandmother     Social History Social History  Substance Use Topics  . Smoking status: Never Smoker  . Smokeless tobacco: Never Used  . Alcohol use No     Allergies   Penicillins and Sulfa antibiotics   Review of Systems Review of Systems  Respiratory: Negative for shortness of breath.   Cardiovascular: Negative for chest pain.  Gastrointestinal: Positive for nausea. Negative for abdominal pain and vomiting.  Genitourinary: Positive for frequency. Negative for dysuria.  Musculoskeletal: Positive for back pain. Negative for neck pain.  Neurological: Positive for weakness and numbness. Negative for dizziness and  headaches.  All other systems reviewed and are negative.    Physical Exam Updated Vital Signs BP 141/91   Pulse 91   Temp 98.3 F (36.8 C) (Oral)   Resp 14   SpO2 100%   Physical Exam  Constitutional: She is oriented to person, place, and time. She appears well-developed and well-nourished. No distress.  HENT:  Head: Normocephalic and atraumatic.  Right Ear: External ear normal.  Left Ear: External ear normal.  Nose: Nose normal.  Mouth/Throat: Oropharynx is clear and moist.  Eyes: EOM are normal. Pupils are equal, round, and reactive to light. Right eye exhibits no discharge. Left eye exhibits no discharge.   Neck: Normal range of motion. Neck supple.  Cardiovascular: Normal rate, regular rhythm and normal heart sounds.   Pulmonary/Chest: Effort normal and breath sounds normal.  Abdominal: Soft. She exhibits no distension. There is no tenderness.  Musculoskeletal:       Lumbar back: She exhibits tenderness (midline lower).  Neurological: She is alert and oriented to person, place, and time.  Reflex Scores:      Patellar reflexes are 2+ on the right side and 2+ on the left side. CN 3-12 grossly intact. 5/5 strength in all 4 extremities. Grossly normal sensation. Normal finger to nose.   Skin: Skin is warm and dry. She is not diaphoretic.  Nursing note and vitals reviewed.    ED Treatments / Results  Labs (all labs ordered are listed, but only abnormal results are displayed) Labs Reviewed  URINALYSIS, ROUTINE W REFLEX MICROSCOPIC - Abnormal; Notable for the following:       Result Value   Color, Urine STRAW (*)    Hgb urine dipstick SMALL (*)    Squamous Epithelial / LPF 0-5 (*)    All other components within normal limits  BASIC METABOLIC PANEL - Abnormal; Notable for the following:    Sodium 131 (*)    Chloride 98 (*)    Glucose, Bld 105 (*)    Creatinine, Ser 1.08 (*)    All other components within normal limits  CBC WITH DIFFERENTIAL/PLATELET - Abnormal; Notable for the following:    WBC 13.1 (*)    Neutro Abs 10.0 (*)    All other components within normal limits  CBG MONITORING, ED - Abnormal; Notable for the following:    Glucose-Capillary 100 (*)    All other components within normal limits  POC URINE PREG, ED    EKG  EKG Interpretation None       Radiology No results found.  Procedures Procedures (including critical care time)  Medications Ordered in ED Medications - No data to display   Initial Impression / Assessment and Plan / ED Course  I have reviewed the triage vital signs and the nursing notes.  Pertinent labs & imaging results that were  available during my care of the patient were reviewed by me and considered in my medical decision making (see chart for details).  Clinical Course as of Oct 12 11  Sat Oct 10, 2016  2240 Benign neuro exam. CBC/BMP, preg, observe. No UTI on UA  [SG]    Clinical Course User Index [SG] Pricilla Loveless, MD    Urinalysis not consistent with UTI. Patient was hypertensive on arrival but this is slowly down trended. There are no focal signs/symptoms to suggest acute stroke or acute CNS lesion or illness. I do not think emergent imaging is needed. I doubt this is caused by her sciatica, especially with normal lower extremity  function but also she has upper extremity symptoms. She has had this back pain before and I do not think imaging is needed. At this point she feels asymptomatic. She is not pregnant. Discharge home with follow-up with PCP. Prior to discharge she tells me she thinks she might of had a panic attack. She's not sure if she was breathing very fast but if she was tachypneic briefly this could've caused these paresthesias. Discussed strict return per cautions.  Final Clinical Impressions(s) / ED Diagnoses   Final diagnoses:  Paresthesia of bilateral legs  Paresthesia of arm    New Prescriptions New Prescriptions   No medications on file     Pricilla LovelessScott Mariacristina Aday, MD 10/11/16 [phone]

## 2016-10-11 LAB — BASIC METABOLIC PANEL
ANION GAP: 11 (ref 5–15)
BUN: 11 mg/dL (ref 6–20)
CO2: 22 mmol/L (ref 22–32)
Calcium: 9.1 mg/dL (ref 8.9–10.3)
Chloride: 98 mmol/L — ABNORMAL LOW (ref 101–111)
Creatinine, Ser: 1.08 mg/dL — ABNORMAL HIGH (ref 0.44–1.00)
GFR calc non Af Amer: >60 mL/min (ref >60)
GLUCOSE: 105 mg/dL — AB (ref 65–99)
Potassium: 3.8 mmol/L (ref 3.5–5.1)
Sodium: 131 mmol/L — ABNORMAL LOW (ref 135–145)

## 2016-10-11 NOTE — ED Notes (Signed)
Pt verbalized understanding discharge instructions and denies any further needs or questions at this time. VS stable, ambulatory and steady gait.   

## 2016-10-12 ENCOUNTER — Emergency Department (HOSPITAL_COMMUNITY): Payer: 59

## 2016-10-12 ENCOUNTER — Emergency Department (HOSPITAL_COMMUNITY)
Admission: EM | Admit: 2016-10-12 | Discharge: 2016-10-13 | Disposition: A | Discharge status: Home / Self Care | Payer: 59 | Attending: Emergency Medicine | Admitting: Emergency Medicine

## 2016-10-12 ENCOUNTER — Encounter (HOSPITAL_COMMUNITY): Payer: Self-pay

## 2016-10-12 DIAGNOSIS — R110 Nausea: Secondary | ICD-10-CM | POA: Diagnosis present

## 2016-10-12 DIAGNOSIS — R1013 Epigastric pain: Secondary | ICD-10-CM | POA: Diagnosis not present

## 2016-10-12 DIAGNOSIS — Z79899 Other long term (current) drug therapy: Secondary | ICD-10-CM | POA: Diagnosis not present

## 2016-10-12 DIAGNOSIS — R202 Paresthesia of skin: Secondary | ICD-10-CM | POA: Diagnosis not present

## 2016-10-12 LAB — URINALYSIS, ROUTINE W REFLEX MICROSCOPIC
Bilirubin Urine: NEGATIVE
Glucose, UA: NEGATIVE mg/dL
KETONES UR: NEGATIVE mg/dL
Leukocytes, UA: NEGATIVE
Nitrite: NEGATIVE
PROTEIN: NEGATIVE mg/dL
Specific Gravity, Urine: 1.011 (ref 1.005–1.030)
pH: 9 — ABNORMAL HIGH (ref 5.0–8.0)

## 2016-10-12 LAB — CBC
HEMATOCRIT: 44.6 % (ref 36.0–46.0)
Hemoglobin: 15.7 g/dL — ABNORMAL HIGH (ref 12.0–15.0)
MCH: 30.4 pg (ref 26.0–34.0)
MCHC: 35.2 g/dL (ref 30.0–36.0)
MCV: 86.3 fL (ref 78.0–100.0)
PLATELETS: 344 10*3/uL (ref 150–400)
RBC: 5.17 MIL/uL — AB (ref 3.87–5.11)
RDW: 12.8 % (ref 11.5–15.5)
WBC: 13.6 10*3/uL — ABNORMAL HIGH (ref 4.0–10.5)

## 2016-10-12 LAB — COMPREHENSIVE METABOLIC PANEL
ALT: 18 U/L (ref 14–54)
AST: 24 U/L (ref 15–41)
Albumin: 4.4 g/dL (ref 3.5–5.0)
Alkaline Phosphatase: 36 U/L — ABNORMAL LOW (ref 38–126)
Anion gap: 12 (ref 5–15)
BUN: 10 mg/dL (ref 6–20)
CHLORIDE: 105 mmol/L (ref 101–111)
CO2: 19 mmol/L — AB (ref 22–32)
CREATININE: 1 mg/dL (ref 0.44–1.00)
Calcium: 10.5 mg/dL — ABNORMAL HIGH (ref 8.9–10.3)
GFR calc non Af Amer: >60 mL/min (ref >60)
Glucose, Bld: 117 mg/dL — ABNORMAL HIGH (ref 65–99)
POTASSIUM: 3.5 mmol/L (ref 3.5–5.1)
SODIUM: 136 mmol/L (ref 135–145)
Total Bilirubin: 0.7 mg/dL (ref 0.3–1.2)
Total Protein: 7.5 g/dL (ref 6.5–8.1)

## 2016-10-12 LAB — LIPASE, BLOOD: LIPASE: 31 U/L (ref 11–51)

## 2016-10-12 LAB — I-STAT BETA HCG BLOOD, ED (MC, WL, AP ONLY)

## 2016-10-12 MED ORDER — ONDANSETRON 4 MG PO TBDP
4.0000 mg | ORAL_TABLET | Freq: Once | ORAL | Status: AC | PRN
  Administered 2016-10-12: 4 mg via ORAL

## 2016-10-12 MED ORDER — SODIUM CHLORIDE 0.9 % IV BOLUS (SEPSIS)
1000.0000 mL | Freq: Once | INTRAVENOUS | Status: AC
  Administered 2016-10-12: 1000 mL via INTRAVENOUS

## 2016-10-12 MED ORDER — IOPAMIDOL (ISOVUE-300) INJECTION 61%
INTRAVENOUS | Status: AC
Start: 2016-10-12 — End: 2016-10-12
  Administered 2016-10-12: 100 mL
  Filled 2016-10-12: qty 100

## 2016-10-12 MED ORDER — ONDANSETRON 4 MG PO TBDP
ORAL_TABLET | ORAL | Status: DC
Start: 2016-10-12 — End: 2016-10-13
  Filled 2016-10-12: qty 1

## 2016-10-12 MED ORDER — PANTOPRAZOLE SODIUM 40 MG PO TBEC
40.0000 mg | DELAYED_RELEASE_TABLET | Freq: Once | ORAL | Status: AC
  Administered 2016-10-13: 40 mg via ORAL
  Filled 2016-10-12: qty 1

## 2016-10-12 MED ORDER — LORAZEPAM 2 MG/ML IJ SOLN
0.5000 mg | Freq: Once | INTRAMUSCULAR | Status: AC
  Administered 2016-10-13: 0.5 mg via INTRAVENOUS
  Filled 2016-10-12: qty 1

## 2016-10-12 NOTE — ED Triage Notes (Signed)
Pt complaining of nausea/vomiting x 2 weeks. Pt complaining of abdominal pain since this evening. Pt tachy at triage. Pt states frequent urination. Pt denies any bleeding/discahrge.

## 2016-10-12 NOTE — ED Notes (Signed)
Pt's boyfriend in hall.  States they are concerned patient may have brain tumor and requesting head CT

## 2016-10-12 NOTE — ED Provider Notes (Signed)
[facility]-EMERGENCY DEPT Provider Note   CSN: [id number] Arrival date & time: 10/12/16  2016     History   Chief Complaint Chief Complaint  Patient presents with  . Abdominal Pain    HPI Nicole Kelly is a 35 y.o. female with a hx of Anxiety, depression presents to the Emergency Department complaining of gradual, persistent, progressively worsening nausea onset 2 weeks ago. Patient reports a history of severe motion sickness. She uses scopolamine patches. She has tried these and Zofran in the last 2 weeks without relief. Patient reports negative pregnancy test at home. Last menstrual period 08/12/2016.  Patient denies abdominal pain, diarrhea. Patient reports vomiting of nonbloody nonbilious emesis 2 weeks ago with the onset of the symptoms but none since that time. She reports general malaise and fatigue but no syncope. No dysuria or hematuria. She does report some increased frequency but no urgency. She denies vaginal bleeding or vaginal discharge. Nothing makes her symptoms better or worse. Symptoms are not aggravated or alleviated by food. She also denies fever, chills, headache, neck pain, chest pain, shortness of breath.  Patient is very anxious exposing concerns about her gallbladder and potential kidney stones. She's not had any hematuria. No right upper quadrant abdominal pain.  The history is provided by the patient and medical records. No language interpreter was used.    Past Medical History:  Diagnosis Date  . Anxiety   . Depression   . Hx of varicella   . Pregnancy induced hypertension     Patient Active Problem List   Diagnosis Date Noted  . Pregnancy 11/24/2013    Past Surgical History:  Procedure Laterality Date  . NO PAST SURGERIES      OB History    Gravida Para Term Preterm AB Living   2 2 1 1   2    SAB TAB Ectopic Multiple Live Births           2       Home Medications    Prior to Admission medications   Medication Sig Start Date End Date Taking?  Authorizing Provider  buPROPion (WELLBUTRIN XL) 150 MG 24 hr tablet Take 150 mg by mouth daily.      Historical Provider, MD  ibuprofen (ADVIL,MOTRIN) 800 MG tablet Take 1 tablet (800 mg total) by mouth every 8 (eight) hours as needed for moderate pain. Patient not taking: Reported on 10/25/2014 11/26/13   Richarda Overlie, MD  metoCLOPramide (REGLAN) 10 MG tablet Take 1 tablet (10 mg total) by mouth every 6 (six) hours. 10/13/16   Oaklyn Mans, PA-C  norethindrone (ERRIN) 0.35 MG tablet Take 1 tablet by mouth daily.    Historical Provider, MD  omeprazole (PRILOSEC) 20 MG capsule Take 1 capsule (20 mg total) by mouth daily. 10/13/16   Anahlia Iseminger, PA-C  oxyCODONE-acetaminophen (PERCOCET/ROXICET) 5-325 MG per tablet Take 1-2 tablets by mouth every 6 (six) hours as needed for moderate pain. Patient not taking: Reported on 10/25/2014 11/26/13   Richarda Overlie, MD  Prenatal Vit-Fe Fumarate-FA (PRENATAL MULTIVITAMIN) TABS tablet Take 1 tablet by mouth at bedtime.    Historical Provider, MD    Family History Family History  Problem Relation Age of Onset  . Hypertension Paternal Grandmother   . Heart disease Mother   . Thyroid disease Mother   . Heart disease Father   . Hypertension Father   . Diabetes Maternal Grandmother     Social History Social History  Substance Use Topics  . Smoking status: Never Smoker  .  Smokeless tobacco: Never Used  . Alcohol use No     Allergies   Penicillins and Sulfa antibiotics   Review of Systems Review of Systems  Constitutional: Positive for fatigue.  Gastrointestinal: Positive for constipation, nausea and vomiting ( 2 weeks ago, resolved). Negative for abdominal pain.  All other systems reviewed and are negative.    Physical Exam Updated Vital Signs BP 141/94   Pulse 79   Temp 97.6 F (36.4 C) (Oral)   Resp 22   Ht 5\' 3"  (1.6 m)   Wt 49.5 kg   LMP 08/12/2016 (Approximate) Comment: on continuous BCP  SpO2 99%   BMI 19.34 kg/m     Physical Exam  Constitutional: She appears well-developed and well-nourished. No distress.  Awake, alert, nontoxic appearance  HENT:  Head: Normocephalic and atraumatic.  Mouth/Throat: Oropharynx is clear and moist. Mucous membranes are not dry.  Eyes: Conjunctivae are normal. No scleral icterus.  Neck: Normal range of motion. Neck supple.  Cardiovascular: Normal rate, regular rhythm and intact distal pulses.   Pulmonary/Chest: Effort normal and breath sounds normal. No respiratory distress. She has no wheezes.  Equal chest expansion  Abdominal: Soft. Bowel sounds are normal. She exhibits no mass. There is tenderness in the epigastric area. There is no rebound and no guarding.  Musculoskeletal: Normal range of motion. She exhibits no edema.  Neurological: She is alert.  Speech is clear and goal oriented Moves extremities without ataxia  Skin: Skin is warm and dry. She is not diaphoretic.  Psychiatric: Her mood appears anxious.  Nursing note and vitals reviewed.    ED Treatments / Results  Labs (all labs ordered are listed, but only abnormal results are displayed) Labs Reviewed  COMPREHENSIVE METABOLIC PANEL - Abnormal; Notable for the following:       Result Value   CO2 19 (*)    Glucose, Bld 117 (*)    Calcium 10.5 (*)    Alkaline Phosphatase 36 (*)    All other components within normal limits  CBC - Abnormal; Notable for the following:    WBC 13.6 (*)    RBC 5.17 (*)    Hemoglobin 15.7 (*)    All other components within normal limits  URINALYSIS, ROUTINE W REFLEX MICROSCOPIC - Abnormal; Notable for the following:    pH 9.0 (*)    Hgb urine dipstick SMALL (*)    Bacteria, UA RARE (*)    Squamous Epithelial / LPF 0-5 (*)    All other components within normal limits  LIPASE, BLOOD  I-STAT BETA HCG BLOOD, ED ([facility], [facility], [facility] ONLY)    Radiology Ct Head Wo Contrast  Result Date: 10/13/2016 CLINICAL DATA:  Nausea and previous episode or paresthesia EXAM: CT HEAD WITHOUT  CONTRAST TECHNIQUE: Contiguous axial images were obtained from the base of the skull through the vertex without intravenous contrast. COMPARISON:  None. FINDINGS: BRAIN: The ventricles and sulci are normal. No intraparenchymal hemorrhage, mass effect nor midline shift. No acute large vascular territory infarcts. Grey-white matter distinction is maintained. The basal ganglia are unremarkable. No abnormal extra-axial fluid collections. Basal cisterns are patent. The brainstem and cerebellar hemispheres are without acute abnormalities. VASCULAR: Unremarkable. SKULL/SOFT TISSUES: No skull fracture. No significant soft tissue swelling. ORBITS/SINUSES: The included ocular globes and orbital contents are normal.The mastoid air cells are clear. The included paranasal sinuses are well-aerated. Mild mucosal thickening of the sphenoid sinus consistent with chronic sinusitis. OTHER: None. IMPRESSION: No acute intracranial abnormality.  Chronic sphenoid sinusitis. Electronically Signed  By: Tollie Ethavid  Kwon M.D.   On: 10/13/2016 01:58   Ct Abdomen Pelvis W Contrast  Result Date: 10/13/2016 CLINICAL DATA:  Nausea EXAM: CT ABDOMEN AND PELVIS WITH CONTRAST TECHNIQUE: Multidetector CT imaging of the abdomen and pelvis was performed using the standard protocol following bolus administration of intravenous contrast. CONTRAST:  100mL ISOVUE-300 IOPAMIDOL (ISOVUE-300) INJECTION 61% COMPARISON:  None. FINDINGS: LOWER CHEST: Lung bases are clear. Included heart size is normal. No pericardial effusion. HEPATOBILIARY: 5 mm right and 8 mm left hepatic hypodensities may represent small cysts or hemangiomata. Small vascular blush in the right hepatic lobe series 2, image 21 may also reflect a hemangioma but cannot be further evaluated on the study. No biliary dilatation. The gallbladder is free of stones. PANCREAS: Normal. SPLEEN: Normal. ADRENALS/URINARY TRACT: Kidneys are orthotopic, demonstrating symmetric enhancement. No nephrolithiasis,  hydronephrosis or solid renal masses. The unopacified ureters are normal in course and caliber. Urinary bladder is distended and unremarkable. Normal adrenal glands. STOMACH/BOWEL: The stomach, small and large bowel are normal in course without inflammatory changes. There is moderate moderate amount of fecal retention within the cecum and ascending colon. VASCULAR/LYMPHATIC: Aortoiliac vessels are normal in course and caliber. No lymphadenopathy by CT size criteria. REPRODUCTIVE: Normal. OTHER: No intraperitoneal free fluid or free air. MUSCULOSKELETAL: Nonacute. IMPRESSION: Moderate colonic stool burden along the right colon. No acute inflammation or bowel obstruction. Small subcentimeter hypodensities in the liver, too small to further characterize but statistically consistent with cysts and/or hemangiomata. Electronically Signed   By: Tollie Ethavid  Kwon M.D.   On: 10/13/2016 01:52    Procedures Procedures (including critical care time)  Medications Ordered in ED Medications  ondansetron (ZOFRAN-ODT) disintegrating tablet 4 mg (4 mg Oral Given 10/12/16 2024)  sodium chloride 0.9 % bolus 1,000 mL (0 mLs Intravenous Stopped 10/12/16 2328)  pantoprazole (PROTONIX) EC tablet 40 mg (40 mg Oral Given 10/13/16 0114)  iopamidol (ISOVUE-300) 61 % injection (100 mLs  Contrast Given 10/12/16 2351)  LORazepam (ATIVAN) injection 0.5 mg (0.5 mg Intravenous Given 10/13/16 0000)  metoCLOPramide (REGLAN) injection 10 mg (10 mg Intravenous Given 10/13/16 0112)     Initial Impression / Assessment and Plan / ED Course  I have reviewed the triage vital signs and the nursing notes.  Pertinent labs & imaging results that were available during my care of the patient were reviewed by me and considered in my medical decision making (see chart for details).  Clinical Course as of Oct 13 250  Mon Oct 12, 2016  2246 Discussed with patient and spouse risk versus benefit of CT scan including radiation. Patient's abdomen is  soft and nontender. Low likelihood of acute pathology. Patient wishes for CT scan today.  [HM]  2247 Tachycardic at triage however no tachycardia, clinical exam. Patient is afebrile. Pulse Rate: 106 [HM]  2252 Leukocytosis WBC: (!) 13.6 [HM]  2252 Within normal limits AST: 24 [HM]  2252 No evidence of UTI Leukocytes, UA: NEGATIVE [HM]    Clinical Course User Index [HM] Dahlia ClientHannah Lithzy Bernard, PA-C  Patient's workup for persistent nausea is reassuring. Labs are reassuring without electrolyte abnormalities. CT scan of the abdomen is without acute abnormality including no cholelithiasis or pancreatitis. No evidence of colitis or diverticulitis.  CT scan of the head also reassuring. No evidence of brain tumor. Patient's nausea has improved. There has been no emesis throughout her time in the emergency department. Initial and multiple repeat exams of her abdomen remained soft and non-tender. Patient is to follow-up with gastroenterology for further  evaluation.  Will give omeprazole and Reglan for symptom control.  Patient as well as appearing.  Discussed findings with patient and husband. They state understanding and are in agreement with the plan.  Final Clinical Impressions(s) / ED Diagnoses   Final diagnoses:  Nausea    New Prescriptions New Prescriptions   METOCLOPRAMIDE (REGLAN) 10 MG TABLET    Take 1 tablet (10 mg total) by mouth every 6 (six) hours.   OMEPRAZOLE (PRILOSEC) 20 MG CAPSULE    Take 1 capsule (20 mg total) by mouth daily.     Dahlia ClientHannah Makiyla Linch, PA-C 10/13/16 [id number]    Dione Boozeavid Glick, MD 11/06/16 2251

## 2016-10-12 NOTE — ED Notes (Addendum)
CT called stating pt was asking for something for anxiety before her CT scan.

## 2016-10-13 MED ORDER — METOCLOPRAMIDE HCL 10 MG PO TABS: 10.0000 mg | ORAL_TABLET | Freq: Four times a day (QID) | ORAL | 0 refills | Status: DC

## 2016-10-13 MED ORDER — METOCLOPRAMIDE HCL 5 MG/ML IJ SOLN
10.0000 mg | Freq: Once | INTRAMUSCULAR | Status: AC
  Administered 2016-10-13: 10 mg via INTRAVENOUS
  Filled 2016-10-13: qty 2

## 2016-10-13 MED ORDER — OMEPRAZOLE 20 MG PO CPDR: 20.0000 mg | DELAYED_RELEASE_CAPSULE | Freq: Every day | ORAL | 0 refills | Status: DC

## 2016-10-13 NOTE — ED Notes (Signed)
Patient transported to CT 

## 2016-10-13 NOTE — ED Notes (Signed)
Ativan given to pt in CT #3.

## 2016-10-13 NOTE — Discharge Instructions (Signed)
1. Medications: omeprazole, reglan, usual home medications 2. Treatment: rest, drink plenty of fluids, advance diet slowly 3. Follow Up: Please followup with your primary doctor in 2 days for discussion of your diagnoses and further evaluation after today's visit; if you do not have a primary care doctor use the resource guide provided to find one; Please return to the ER for persistent vomiting, high fevers or worsening symptoms

## 2016-10-13 NOTE — ED Notes (Signed)
Pt advised her nausea is relieved.

## 2016-10-13 NOTE — ED Notes (Signed)
Pt returned from CT °

## 2016-10-15 ENCOUNTER — Other Ambulatory Visit (HOSPITAL_COMMUNITY): Payer: Self-pay | Admitting: Physician Assistant

## 2016-10-15 DIAGNOSIS — R634 Abnormal weight loss: Secondary | ICD-10-CM

## 2016-10-15 DIAGNOSIS — R112 Nausea with vomiting, unspecified: Secondary | ICD-10-CM

## 2016-10-15 DIAGNOSIS — R1084 Generalized abdominal pain: Secondary | ICD-10-CM

## 2016-10-21 ENCOUNTER — Ambulatory Visit (HOSPITAL_COMMUNITY): Payer: 59

## 2018-06-12 ENCOUNTER — Emergency Department (HOSPITAL_BASED_OUTPATIENT_CLINIC_OR_DEPARTMENT_OTHER)
Admission: EM | Admit: 2018-06-12 | Discharge: 2018-06-12 | Disposition: A | Discharge status: Home / Self Care | Payer: 59 | Attending: Emergency Medicine | Admitting: Emergency Medicine

## 2018-06-12 ENCOUNTER — Encounter (HOSPITAL_BASED_OUTPATIENT_CLINIC_OR_DEPARTMENT_OTHER): Payer: Self-pay | Admitting: *Deleted

## 2018-06-12 DIAGNOSIS — S61052A Open bite of left thumb without damage to nail, initial encounter: Secondary | ICD-10-CM | POA: Insufficient documentation

## 2018-06-12 DIAGNOSIS — Z79899 Other long term (current) drug therapy: Secondary | ICD-10-CM | POA: Insufficient documentation

## 2018-06-12 DIAGNOSIS — Y92017 Garden or yard in single-family (private) house as the place of occurrence of the external cause: Secondary | ICD-10-CM | POA: Diagnosis not present

## 2018-06-12 DIAGNOSIS — W5501XA Bitten by cat, initial encounter: Secondary | ICD-10-CM | POA: Insufficient documentation

## 2018-06-12 DIAGNOSIS — Y93K9 Activity, other involving animal care: Secondary | ICD-10-CM | POA: Diagnosis not present

## 2018-06-12 DIAGNOSIS — Y999 Unspecified external cause status: Secondary | ICD-10-CM | POA: Diagnosis not present

## 2018-06-12 MED ORDER — AMOXICILLIN-POT CLAVULANATE 875-125 MG PO TABS
1.0000 | ORAL_TABLET | Freq: Once | ORAL | Status: AC
  Administered 2018-06-12: 1 via ORAL
  Filled 2018-06-12: qty 1

## 2018-06-12 MED ORDER — AMOXICILLIN-POT CLAVULANATE 875-125 MG PO TABS: 1.0000 | ORAL_TABLET | Freq: Two times a day (BID) | ORAL | 0 refills | Status: DC

## 2018-06-12 NOTE — Discharge Instructions (Addendum)
Contact a health care provider if: °You have increasing redness, swelling, or pain at the site of your wound. °You have a general feeling of sickness (malaise). °You feel nauseous or you vomit. °You have pain that does not get better. °Get help right away if: °You have a red streak extending away from your wound. °You have fluid, blood, or pus coming from your wound. °You have a fever or chills. °You have trouble moving your injured area. °You have numbness or tingling extending beyond the wound. °

## 2018-06-12 NOTE — ED Triage Notes (Signed)
Pt presents with cat bite to left hand. It is her cat. States cat was frightened by a dog when it bit her. Pt has puncture wound at base of left thumb

## 2018-06-12 NOTE — ED Provider Notes (Signed)
[facility] EMERGENCY DEPARTMENT Provider Note   CSN: [id number] Arrival date & time: 06/12/18  1717     History   Chief Complaint Chief Complaint  Patient presents with  . Animal Bite    HPI Nicole Kelly is a 37 y.o. female.  Who presents the emergency department for evaluation of cat bite to the left thumb.  Patient states that she was bitten by a cat that she cares for in her backyard.  This cat is predominantly an outdoor cat that they have had fixed.  He had a rabies vaccine about 10 years ago.  Patient states that she feeds it daily and it was acting normally but got scared by an approaching dog and then bent her on the thumb.  She had her last text this vaccination in 2014.  She states that the cat was otherwise acting normally.  She has access to the cat immediately.  She denies fevers, chills, drainage from the wound, numbness tingling or inability to move the joint  HPI  Past Medical History:  Diagnosis Date  . Anxiety   . Depression   . Hx of varicella   . Pregnancy induced hypertension     Patient Active Problem List   Diagnosis Date Noted  . Pregnancy 11/24/2013    Past Surgical History:  Procedure Laterality Date  . BUNIONECTOMY Bilateral   . NO PAST SURGERIES       OB History    Gravida  2   Para  2   Term  1   Preterm  1   AB      Living  2     SAB      TAB      Ectopic      Multiple      Live Births  2            Home Medications    Prior to Admission medications   Medication Sig Start Date End Date Taking? Authorizing Provider  buPROPion (WELLBUTRIN XL) 150 MG 24 hr tablet Take 150 mg by mouth daily.     Yes [provider]  Terri Piedra 91-Day (SEASONIQUE PO) Take by mouth.   Yes [provider]  sertraline (ZOLOFT) 25 MG tablet Take 25 mg by mouth daily.   Yes [provider]  ibuprofen (ADVIL,MOTRIN) 800 MG tablet Take 1 tablet (800 mg total) by mouth every 8 (eight)  hours as needed for moderate pain. Patient not taking: Reported on 10/25/2014 11/26/13   Richarda Overlie, MD  metoCLOPramide (REGLAN) 10 MG tablet Take 1 tablet (10 mg total) by mouth every 6 (six) hours. 10/13/16   Muthersbaugh, Dahlia Client, PA-C  norethindrone (ERRIN) 0.35 MG tablet Take 1 tablet by mouth daily.    [provider]  omeprazole (PRILOSEC) 20 MG capsule Take 1 capsule (20 mg total) by mouth daily. 10/13/16   Muthersbaugh, Dahlia Client, PA-C  oxyCODONE-acetaminophen (PERCOCET/ROXICET) 5-325 MG per tablet Take 1-2 tablets by mouth every 6 (six) hours as needed for moderate pain. Patient not taking: Reported on 10/25/2014 11/26/13   Richarda Overlie, MD  Prenatal Vit-Fe Fumarate-FA (PRENATAL MULTIVITAMIN) TABS tablet Take 1 tablet by mouth at bedtime.    [provider]    Family History Family History  Problem Relation Age of Onset  . Hypertension Paternal Grandmother   . Heart disease Mother   . Thyroid disease Mother   . Heart disease Father   . Hypertension Father   . Diabetes Maternal Grandmother  Social History Social History   Tobacco Use  . Smoking status: Never Smoker  . Smokeless tobacco: Never Used  Substance Use Topics  . Alcohol use: Yes  . Drug use: No     Allergies   Penicillins and Sulfa antibiotics   Review of Systems Review of Systems  Ten systems reviewed and are negative for acute change, except as noted in the HPI.   Physical Exam Updated Vital Signs Ht 5\' 3"  (1.6 m)   Wt 53.1 kg   BMI 20.73 kg/m   Physical Exam  Physical Exam  Nursing note and vitals reviewed. Constitutional: She is oriented to person, place, and time. She appears well-developed and well-nourished. No distress.  HENT:  Head: Normocephalic and atraumatic.  Eyes: Conjunctivae normal and EOM are normal. Pupils are equal, round, and reactive to light. No scleral icterus.  Neck: Normal range of motion.  Cardiovascular: Normal rate, regular rhythm and normal  heart sounds.  Exam reveals no gallop and no friction rub.   No murmur heard. Pulmonary/Chest: Effort normal and breath sounds normal. No respiratory distress.  Abdominal: Soft. Bowel sounds are normal. She exhibits no distension and no mass. There is no tenderness. There is no guarding.  Musculoskeletal: 2 small puncture wounds in the thenar eminence and the dorsum of the thumb without bleeding, full range of motion of the thumb at all joints without pain, NVI. Neurological: She is alert and oriented to person, place, and time.  Skin: Skin is warm and dry. She is not diaphoretic.    ED Treatments / Results  Labs (all labs ordered are listed, but only abnormal results are displayed) Labs Reviewed - No data to display  EKG None  Radiology No results found.  Procedures Procedures (including critical care time)  Medications Ordered in ED Medications - No data to display   Initial Impression / Assessment and Plan / ED Course  I have reviewed the triage vital signs and the nursing notes.  Pertinent labs & imaging results that were available during my care of the patient were reviewed by me and considered in my medical decision making (see chart for details).     Patient with access to the cat in her backyard.  We discussed immediate rabies vaccination versus confinement and observation.  I have given the patient CDC recommendations and advised her that she does have time to follow-up and get rabies vaccinations should she wish to.  Patient agrees that she will observe the cat for the next 10 days under quarantine.  The patient was given initial dose of Augmentin here and her wound was cleansed.  I discussed return precautions.  Offered that she is able to return to us at any time for vaccination should she need them.  She appears appropriate for discharge at this time  Final Clinical Impressions(s) / ED Diagnoses   Final diagnoses:  None    ED Discharge Orders    None         Arthor CaptainHarris, Jeliyah Middlebrooks, PA-C 06/12/18 1939    Arby BarrettePfeiffer, Marcy, MD 06/17/18 [id number]

## 2019-01-17 ENCOUNTER — Ambulatory Visit (INDEPENDENT_AMBULATORY_CARE_PROVIDER_SITE_OTHER): Payer: 59 | Admitting: Cardiology

## 2019-01-17 ENCOUNTER — Other Ambulatory Visit: Payer: Self-pay

## 2019-01-17 ENCOUNTER — Encounter: Payer: Self-pay | Admitting: Cardiology

## 2019-01-17 VITALS — BP 131/83 | HR 80 | Ht 63.0 in | Wt 120.2 lb

## 2019-01-17 DIAGNOSIS — R0789 Other chest pain: Secondary | ICD-10-CM | POA: Insufficient documentation

## 2019-01-17 DIAGNOSIS — Z8249 Family history of ischemic heart disease and other diseases of the circulatory system: Secondary | ICD-10-CM | POA: Diagnosis not present

## 2019-01-17 NOTE — Progress Notes (Signed)
Patient referred by Teena Irani, PA-C for chest pain.  Subjective:   Nicole Kelly, female    DOB: [birth year], 38 y.o.   MRN: [id number]   Chief Complaint  Patient presents with  . Chest Pain    pt c/o chest pain for 3 months off and on , after starting new exercise routine     HPI  38 y.o. Caucasian female with family history of early coronary artery disease, with family history of early CAD with father having quadruple bypass surgery in his 41s, and mother having stroke as well as coronary stents in her 32s.  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    Patient reports having lower chest/epigastric pain for last 2 weeks or so.  Pain is exacerbated by certain movements such as push-ups and abdominal crunch exercises.  Pain is reproducible with pressing in her lower chest area.  Patient did not have this pain when she walked for 2 miles in 30 minutes on treadmill.  She did experience "lung burning sensation" when she tried to run 2 miles in 20 minutes.  Patient reports not having had run that fast in several years. Patient does experience the above sensation with emotional stress.  Patient reports similar pains that occurred in 2016 and 2017, for which he underwent CT angiogram for PE which was negative, and exercise treadmill stress test that showed excellent exercise capacity without any ischemic changes.   Past Medical History:  Diagnosis Date  . Anxiety   . Depression   . Hx of varicella   . Pregnancy induced hypertension      Past Surgical History:  Procedure Laterality Date  . BUNIONECTOMY Bilateral   . NO PAST SURGERIES       Social History   Socioeconomic History  . Marital status: Married    Spouse name: Not on file  . Number of children: 2  . Years of education: Not on file  . Highest education level: Not on file  Occupational History  . Not on file  Social Needs  . Financial resource strain: Not on  file  . Food insecurity:    Worry: Not on file    Inability: Not on file  . Transportation needs:    Medical: Not on file    Non-medical: Not on file  Tobacco Use  . Smoking status: Former Smoker    Types: Cigarettes  . Smokeless tobacco: Never Used  . Tobacco comment: smoked for 4 years , 1-2 cigs per day  Substance and Sexual Activity  . Alcohol use: Yes    Alcohol/week: 1.0 standard drinks    Types: 1 Glasses of wine per week  . Drug use: No  . Sexual activity: Yes    Birth control/protection: None    Comment: last intercourse was about 3 weeks ago  Lifestyle  . Physical activity:    Days per week: Not on file    Minutes per session: Not on file  . Stress: Not on file  Relationships  . Social connections:    Talks on phone: Not on file    Gets together: Not on file    Attends religious service: Not on file    Active member of club or organization: Not on file    Attends meetings of clubs or organizations: Not on file    Relationship status: Not on file  . Intimate partner violence:    Fear of  current or ex partner: Not on file    Emotionally abused: Not on file    Physically abused: Not on file    Forced sexual activity: Not on file  Other Topics Concern  . Not on file  Social History Narrative  . Not on file     Current Outpatient Medications on File Prior to Visit  Medication Sig Dispense Refill  . buPROPion (WELLBUTRIN XL) 150 MG 24 hr tablet Take 150 mg by mouth daily.      . cetirizine (ZYRTEC) 10 MG tablet Take 10 mg by mouth daily.    Marland Kitchen ibuprofen (ADVIL,MOTRIN) 800 MG tablet Take 1 tablet (800 mg total) by mouth every 8 (eight) hours as needed for moderate pain. 30 tablet 0  . Levonorgest-Eth Estrad 91-Day (SEASONIQUE PO) Take by mouth.    . Multiple Vitamin tablet Take 1 tablet by mouth daily.    . sertraline (ZOLOFT) 25 MG tablet Take 25 mg by mouth daily.     No current facility-administered medications on file prior to visit.     Cardiovascular  studies: Outside EKG 12/29/2018: Sinus rhythm 78 bpm. Normal EKG.   Recent labs: Will obtain labs from PCP  Review of Systems  Constitution: Negative for decreased appetite, malaise/fatigue, weight gain and weight loss.  HENT: Negative for congestion.   Eyes: Negative for visual disturbance.  Cardiovascular: Positive for chest pain. Negative for claudication, dyspnea on exertion, leg swelling, palpitations and syncope.  Respiratory: Negative for shortness of breath.   Endocrine: Negative for cold intolerance.  Hematologic/Lymphatic: Does not bruise/bleed easily.  Skin: Negative for itching and rash.  Musculoskeletal: Negative for myalgias.  Gastrointestinal: Negative for abdominal pain, nausea and vomiting.  Genitourinary: Negative for dysuria.  Neurological: Negative for dizziness and weakness.  Psychiatric/Behavioral: The patient is not nervous/anxious.   All other systems reviewed and are negative.        Vitals:   01/17/19 1041  BP: 131/83  Pulse: 80  SpO2: 98%    Objective:   Physical Exam  Constitutional: She is oriented to person, place, and time. She appears well-developed and well-nourished. No distress.  HENT:  Head: Normocephalic and atraumatic.  Eyes: Pupils are equal, round, and reactive to light. Conjunctivae are normal.  Neck: No JVD present.  Cardiovascular: Normal rate, regular rhythm and intact distal pulses.  No murmur heard. Pulmonary/Chest: Effort normal and breath sounds normal. She has no wheezes. She has no rales.  Reproducible chest wall tenderness and lower central chest.  Abdominal: Soft. Bowel sounds are normal. There is no rebound.  Musculoskeletal:        General: No edema.  Lymphadenopathy:    She has no cervical adenopathy.  Neurological: She is alert and oriented to person, place, and time. No cranial nerve deficit.  Skin: Skin is warm and dry.  Psychiatric: She has a normal mood and affect.  Nursing note and vitals reviewed.          Assessment & Recommendations:   38 year old Caucasian female with family history of early CAD, personal history of anxiety, here for evaluation of chest pain.  Chest pain: Unlikely to be angina.  Suspect costochondritis or musculoskeletal etiology.  I do not recommend any testing at this time. Reassured the patient. I will obtain lipid panel labs from the patient. Irrespective of her symptoms, could consider calcium screen testing for risk stratification the future. All elective testing/procedures are on hold given COVID outbreak.  I will see her on as needed basis.  Thank you for referring the patient to [place]. Please feel free to contact with any questions.  Nigel Mormon, MD [facility]. [facility] Pager: [phone] Office: [phone] If no answer Cell [phone]

## 2020-01-29 ENCOUNTER — Other Ambulatory Visit: Payer: Self-pay | Admitting: Obstetrics and Gynecology

## 2020-01-29 DIAGNOSIS — R928 Other abnormal and inconclusive findings on diagnostic imaging of breast: Secondary | ICD-10-CM

## 2020-02-09 ENCOUNTER — Ambulatory Visit: Payer: 59

## 2020-02-09 ENCOUNTER — Other Ambulatory Visit: Payer: Self-pay

## 2020-02-09 ENCOUNTER — Ambulatory Visit
Admission: RE | Admit: 2020-02-09 | Discharge: 2020-02-09 | Disposition: A | Discharge status: Home / Self Care | Payer: 59 | Source: Ambulatory Visit | Attending: Obstetrics and Gynecology | Admitting: Obstetrics and Gynecology

## 2020-02-09 DIAGNOSIS — R928 Other abnormal and inconclusive findings on diagnostic imaging of breast: Secondary | ICD-10-CM

## 2020-06-25 ENCOUNTER — Other Ambulatory Visit: Payer: Self-pay

## 2020-06-25 ENCOUNTER — Encounter (HOSPITAL_COMMUNITY): Payer: Self-pay

## 2020-06-25 ENCOUNTER — Ambulatory Visit (HOSPITAL_COMMUNITY)
Admission: EM | Admit: 2020-06-25 | Discharge: 2020-06-25 | Disposition: A | Discharge status: Home / Self Care | Payer: 59 | Attending: Physician Assistant | Admitting: Physician Assistant

## 2020-06-25 ENCOUNTER — Ambulatory Visit (INDEPENDENT_AMBULATORY_CARE_PROVIDER_SITE_OTHER): Payer: 59

## 2020-06-25 DIAGNOSIS — M79621 Pain in right upper arm: Secondary | ICD-10-CM

## 2020-06-25 DIAGNOSIS — M4722 Other spondylosis with radiculopathy, cervical region: Secondary | ICD-10-CM | POA: Diagnosis not present

## 2020-06-25 MED ORDER — TIZANIDINE HCL 4 MG PO TABS: 4.0000 mg | ORAL_TABLET | Freq: Every day | ORAL | 0 refills | Status: AC

## 2020-06-25 MED ORDER — METHYLPREDNISOLONE SODIUM SUCC 125 MG IJ SOLR
125.0000 mg | Freq: Once | INTRAMUSCULAR | Status: AC
  Administered 2020-06-25: 125 mg via INTRAMUSCULAR

## 2020-06-25 MED ORDER — METHYLPREDNISOLONE SODIUM SUCC 125 MG IJ SOLR
INTRAMUSCULAR | Status: AC
  Filled 2020-06-25: qty 2

## 2020-06-25 MED ORDER — PREDNISONE 10 MG PO TABS: 40.0000 mg | ORAL_TABLET | Freq: Every day | ORAL | 0 refills | Status: AC

## 2020-06-25 MED ORDER — ACETAMINOPHEN 500 MG PO TABS: 1000.0000 mg | ORAL_TABLET | Freq: Three times a day (TID) | ORAL | 0 refills | Status: AC | PRN

## 2020-06-25 NOTE — Discharge Instructions (Addendum)
Call the neurosurgery group today for follow up  Take prednisone as prescribed Take muscle relaxer at night, this will make you sleepy, do not drive, drink alcohol or operate machinery with 8 hours You may take tylenol in addition to above - while on prednisone, no aleeve, ibuprofen or similar  Follow up with PCP  If severely worsening, weakness, complete numbness, go to the Emergency department

## 2020-06-25 NOTE — ED Provider Notes (Signed)
[facility]    CSN: [id number] Arrival date & time: 06/25/20  6712      History   Chief Complaint Chief Complaint  Patient presents with   Arm Pain    HPI Nicole Kelly is a 39 y.o. female.   Patient presents for right-sided neck pain that is radiating into her right arm.  She reports symptoms started over the weekend.  She reports this started after doing planks.  She reports she has had issues with her neck in the past and is radiating her left arm but never in the right.  She reports pain initially the right-sided neck and trap that is since gone down the arm.  She endorses some tingling and numbness sensation in the fingers and hand.  Mild relief with NSAIDs.  Denies known weakness.  No new injury.  No rashes.  No fevers or chills.     Past Medical History:  Diagnosis Date   Anxiety    Depression    Hx of varicella    Pregnancy induced hypertension     Patient Active Problem List   Diagnosis Date Noted   Atypical chest pain 01/17/2019   Family history of early CAD 01/17/2019   Pregnancy 11/24/2013    Past Surgical History:  Procedure Laterality Date   BUNIONECTOMY Bilateral    WISDOM TOOTH EXTRACTION      OB History    Gravida  2   Para  2   Term  1   Preterm  1   AB      Living  2     SAB      TAB      Ectopic      Multiple      Live Births  2            Home Medications    Prior to Admission medications   Medication Sig Start Date End Date Taking? Authorizing Provider  acetaminophen (TYLENOL) 500 MG tablet Take 2 tablets (1,000 mg total) by mouth every 8 (eight) hours as needed. 06/25/20   Kaegan Hettich, Veryl Speak, PA-C  buPROPion (WELLBUTRIN XL) 150 MG 24 hr tablet Take 150 mg by mouth daily.      [provider]  cetirizine (ZYRTEC) 10 MG tablet Take 10 mg by mouth daily.    [provider]  ibuprofen (ADVIL,MOTRIN) 800 MG tablet Take 1 tablet (800 mg total) by mouth every 8 (eight) hours as needed for  moderate pain. 11/26/13   Richarda Overlie, MD  Levonorgest-Eth Charlott Holler 91-Day (SEASONIQUE PO) Take by mouth.    [provider]  Multiple Vitamin tablet Take 1 tablet by mouth daily.    [provider]  predniSONE (DELTASONE) 10 MG tablet Take 4 tablets (40 mg total) by mouth daily with breakfast for 5 days. 06/25/20 06/30/20  Abrian Hanover, Veryl Speak, PA-C  sertraline (ZOLOFT) 25 MG tablet Take 25 mg by mouth daily.    [provider]  tiZANidine (ZANAFLEX) 4 MG tablet Take 1 tablet (4 mg total) by mouth at bedtime for 10 days. 06/25/20 07/05/20  Larcenia Holaday, Veryl Speak, PA-C    Family History Family History  Problem Relation Age of Onset   Hypertension Paternal Grandmother    Heart disease Mother    Thyroid disease Mother    Heart disease Father    Hypertension Father    Diabetes Maternal Grandmother     Social History Social History   Tobacco Use   Smoking status: Former Smoker  Types: Cigarettes   Smokeless tobacco: Never Used   Tobacco comment: smoked for 4 years , 1-2 cigs per day  Vaping Use   Vaping Use: Never used  Substance Use Topics   Alcohol use: Yes    Alcohol/week: 1.0 standard drink    Types: 1 Glasses of wine per week   Drug use: No     Allergies   Penicillins and Sulfa antibiotics   Review of Systems Review of Systems   Physical Exam Triage Vital Signs ED Triage Vitals  Enc Vitals Group     BP 06/25/20 1053 139/82     Pulse Rate 06/25/20 1053 98     Resp 06/25/20 1053 17     Temp 06/25/20 1053 98.7 F (37.1 C)     Temp Source 06/25/20 1053 Oral     SpO2 06/25/20 1053 100 %     Weight 06/25/20 1051 124 lb (56.2 kg)     Height --      Head Circumference --      Peak Flow --      Pain Score 06/25/20 1051 10     Pain Loc --      Pain Edu? --      Excl. in GC? --    No data found.  Updated Vital Signs BP 139/82 (BP Location: Left Arm)    Pulse 98    Temp 98.7 F (37.1 C) (Oral)    Resp 17    Wt 124 lb (56.2 kg)    SpO2 100%     BMI 21.97 kg/m   Visual Acuity Right Eye Distance:   Left Eye Distance:   Bilateral Distance:    Right Eye Near:   Left Eye Near:    Bilateral Near:     Physical Exam Vitals and nursing note reviewed.  Constitutional:      Appearance: Normal appearance.  Cardiovascular:     Rate and Rhythm: Normal rate and regular rhythm.  Pulmonary:     Effort: Pulmonary effort is normal.     Breath sounds: Normal breath sounds.  Musculoskeletal:     Cervical back: Normal range of motion. Tenderness (No midline tenderness.  Right-sided paraspinal musculature tenderness spreading into the right trapezius.  Some spasm of the right trapezius.) present. No rigidity.     Comments: Patient has full range of motion of the right upper extremity, some pain through range of motion, poorly localized and specific.  No bony tenderness of the shoulder girdle.  No severe tenderness of the soft tissues of the shoulder.  Poorly localized pain with specific test to speeds and empty can test.  Some relief of pain with arm overhead, however intensifies tingling sensation.  4/5 strength in the right versus left at the elbow, wrist, grip and shoulder.  Slight diminished sensation in the right versus left with light touch.  Radial pulse 2+.  Cap refill less than 2 seconds.  Neurological:     Mental Status: She is alert.      UC Treatments / Results  Labs (all labs ordered are listed, but only abnormal results are displayed) Labs Reviewed - No data to display  EKG   Radiology DG Cervical Spine Complete  Result Date: 06/25/2020 CLINICAL DATA:  Acute right upper extremity pain. EXAM: CERVICAL SPINE - COMPLETE 4+ VIEW COMPARISON:  None. FINDINGS: Minimal retrolisthesis of C5-6 is noted secondary to severe degenerative disc disease at this level. No fracture is noted. No prevertebral soft tissue swelling is noted. Remaining  disc spaces are unremarkable. Mild bilateral neural foraminal stenosis is noted at C5-6  secondary to uncovertebral spurring. IMPRESSION: Severe degenerative disc disease is noted at C5-6 with mild bilateral neural foraminal stenosis at this level. Electronically Signed   By: Lupita Raider M.D.   On: 06/25/2020 11:48    Procedures Procedures (including critical care time)  Medications Ordered in UC Medications  methylPREDNISolone sodium succinate (SOLU-MEDROL) 125 mg/2 mL injection 125 mg (125 mg Intramuscular Given 06/25/20 1207)    Initial Impression / Assessment and Plan / UC Course  I have reviewed the triage vital signs and the nursing notes.  Pertinent labs & imaging results that were available during my care of the patient were reviewed by me and considered in my medical decision making (see chart for details).     #Cervical radiculopathy likely secondary to degenerative joint Patient is a 39 year old presenting with cervical radiculopathy.  Found to have degenerative changes C5-C6 with neuroforaminal stenosis.  Likely this is causing her symptoms today.  We'll treat with Solu-Medrol in clinic and prednisone outpatient and will for her to spine specialist.  Patient verbalized agreement understanding plan of care Final Clinical Impressions(s) / UC Diagnoses   Final diagnoses:  Cervical radiculopathy due to degenerative joint disease of spine     Discharge Instructions     Call the neurosurgery group today for follow up  Take prednisone as prescribed Take muscle relaxer at night, this will make you sleepy, do not drive, drink alcohol or operate machinery with 8 hours You may take tylenol in addition to above - while on prednisone, no aleeve, ibuprofen or similar  Follow up with PCP  If severely worsening, weakness, complete numbness, go to the Emergency department      ED Prescriptions    Medication Sig Dispense Auth. Provider   predniSONE (DELTASONE) 10 MG tablet Take 4 tablets (40 mg total) by mouth daily with breakfast for 5 days. 20 tablet Luismiguel Lamere,  Veryl Speak, PA-C   tiZANidine (ZANAFLEX) 4 MG tablet Take 1 tablet (4 mg total) by mouth at bedtime for 10 days. 10 tablet Kabe Mckoy, Veryl Speak, PA-C   acetaminophen (TYLENOL) 500 MG tablet Take 2 tablets (1,000 mg total) by mouth every 8 (eight) hours as needed. 30 tablet Gino Garrabrant, Veryl Speak, PA-C     PDMP not reviewed this encounter.   Hermelinda Medicus, PA-C 06/25/20 2243

## 2020-06-25 NOTE — ED Triage Notes (Signed)
Pt is here with right arm pain that started Sunday & states she was working out, doing planks & that it hurt ever since. Pt has taken Advil to relieve discomfort.

## 2020-07-05 ENCOUNTER — Other Ambulatory Visit: Payer: Self-pay | Admitting: Neurosurgery

## 2020-07-05 DIAGNOSIS — M5412 Radiculopathy, cervical region: Secondary | ICD-10-CM

## 2020-07-29 ENCOUNTER — Other Ambulatory Visit: Payer: 59

## 2021-04-16 IMAGING — DX DG CERVICAL SPINE COMPLETE 4+V
5 series · 5 of 5 positions shown · non-contrast
Comparison: None.

CLINICAL DATA: Acute right upper extremity pain.

EXAM:
CERVICAL SPINE - COMPLETE 4+ VIEW

[c-spine lat]
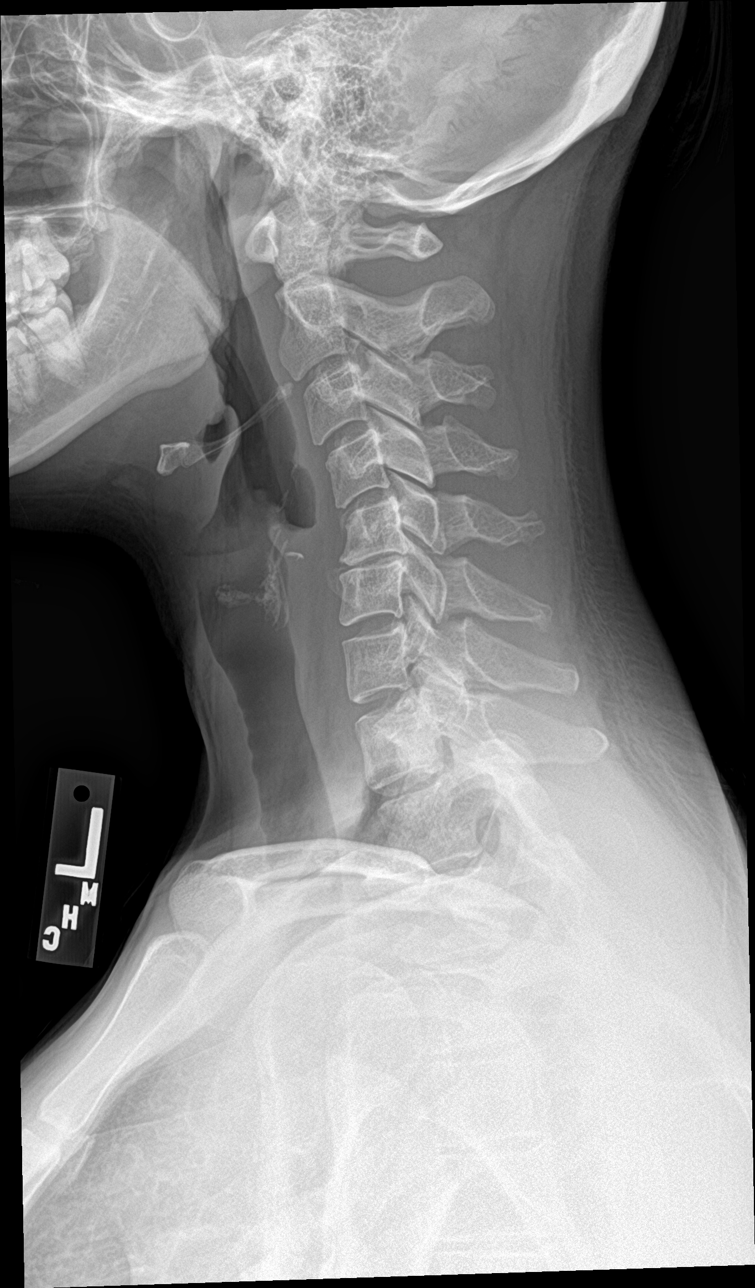

[c-spine obl (1 of 2)]
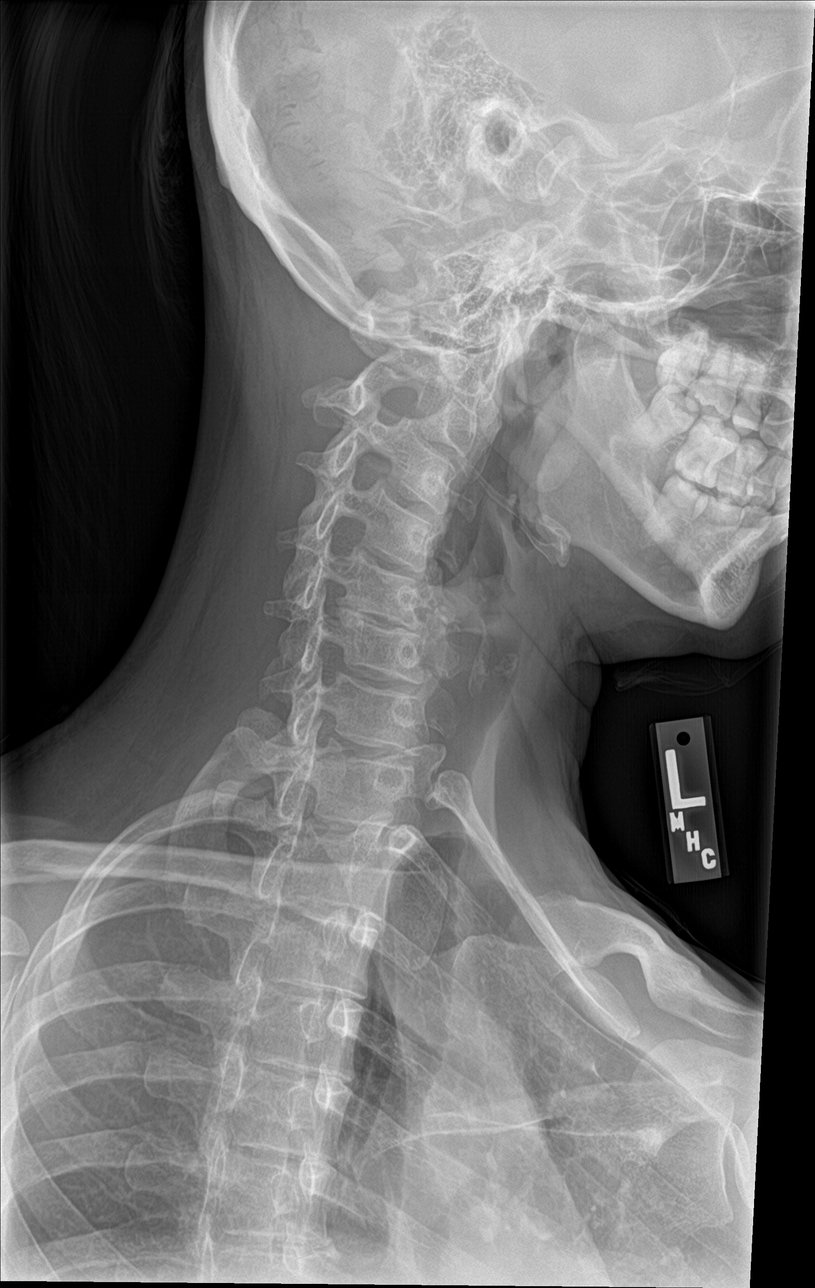

[c-spine obl (2 of 2)]
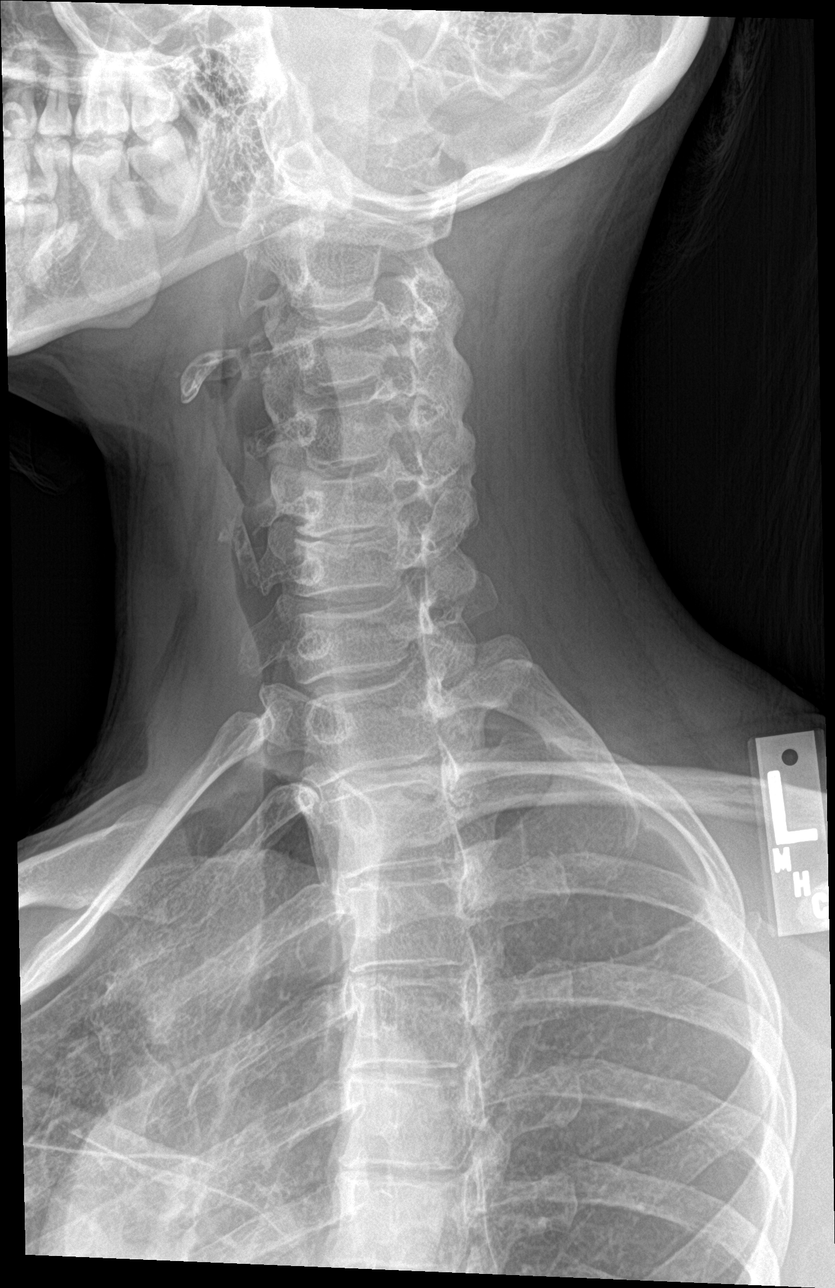

[c-spine ap]
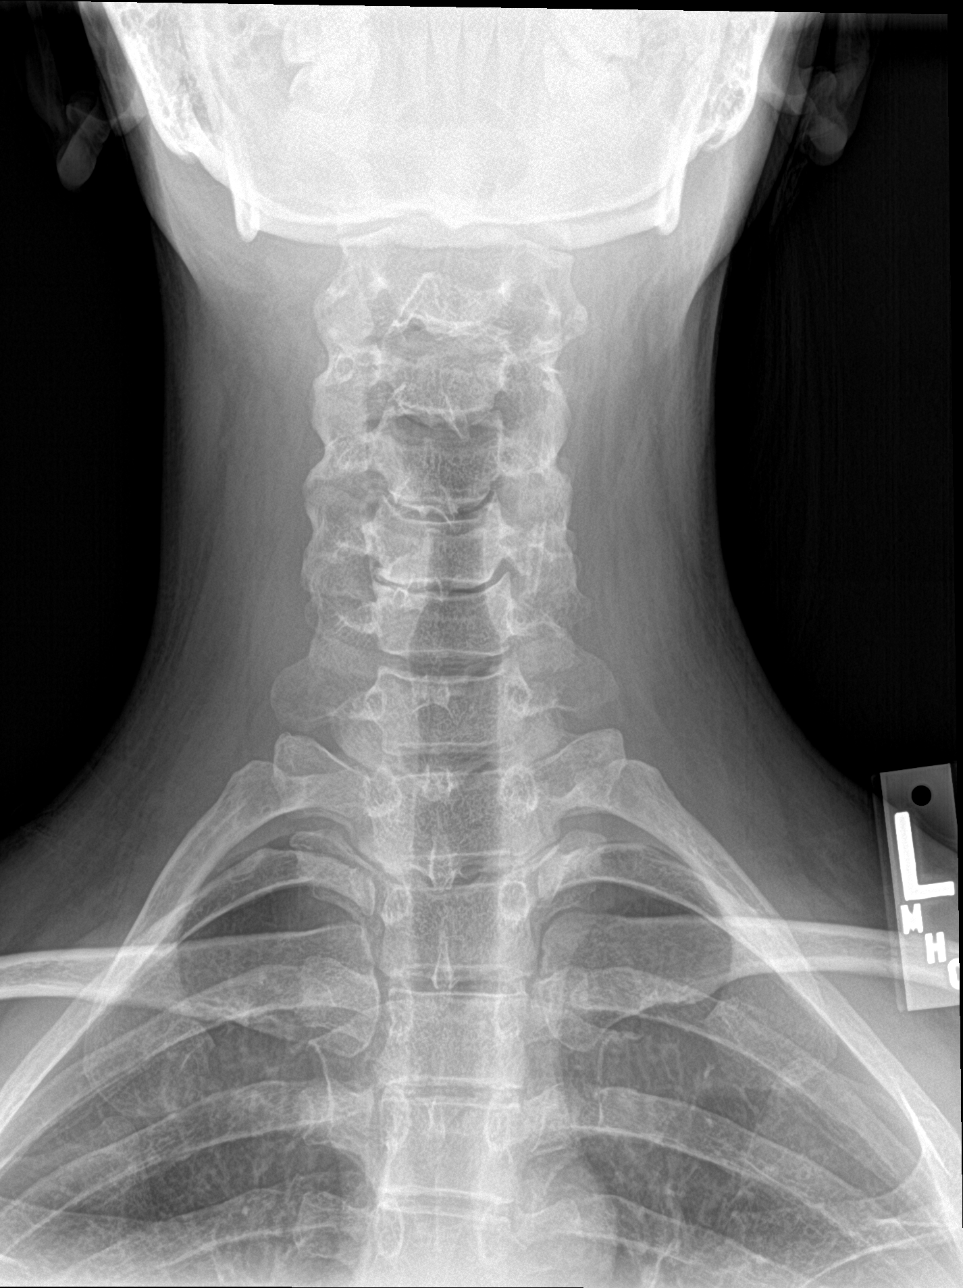

[c-spine open mouth]
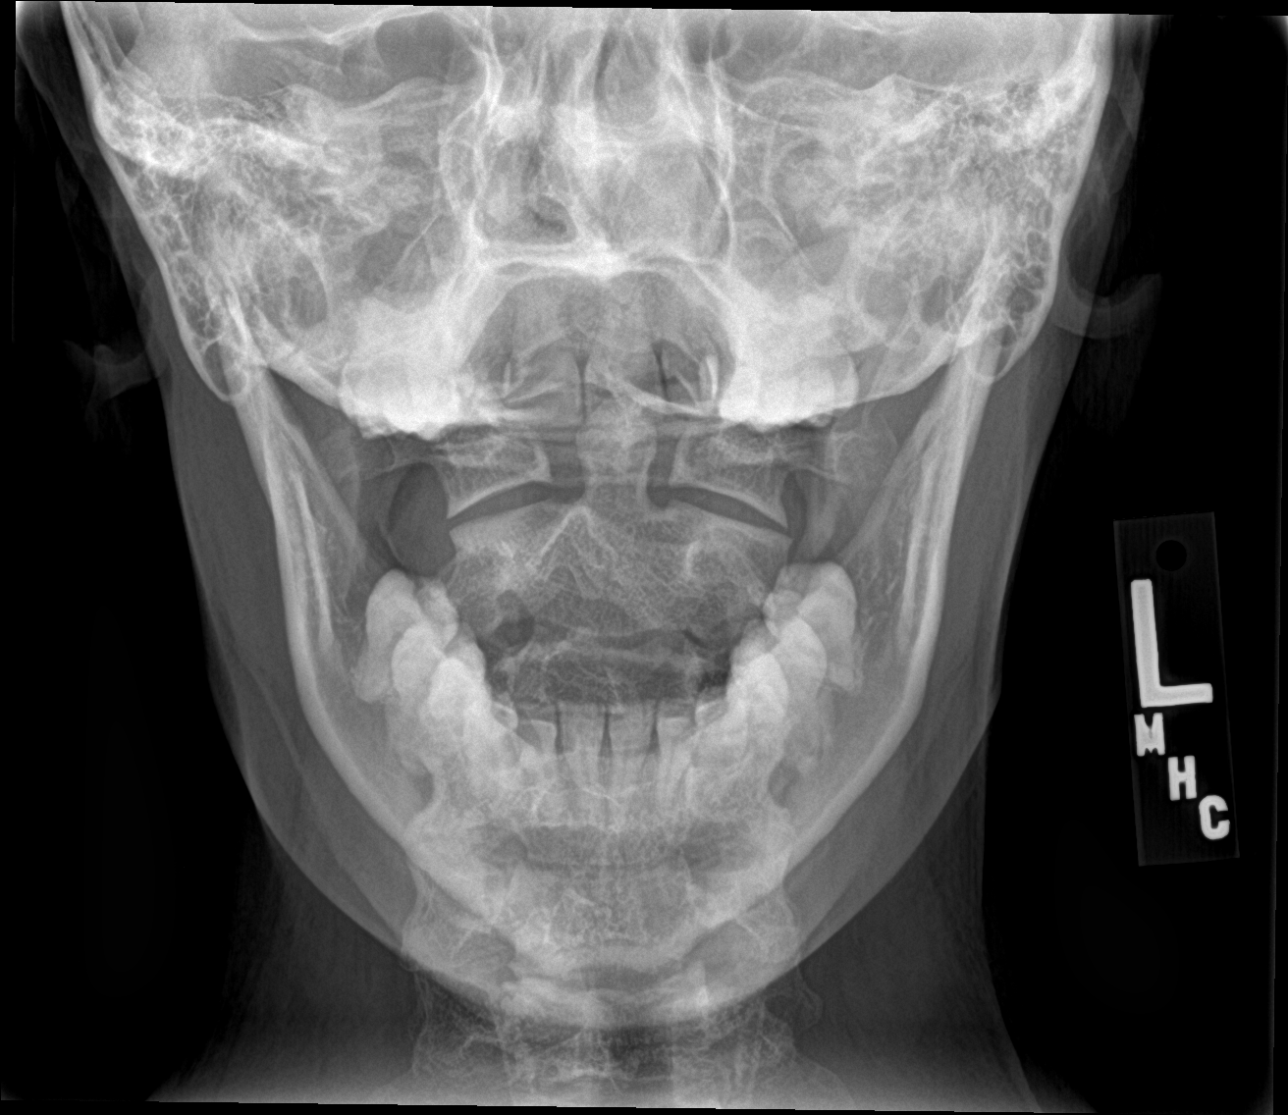

[5 of 5 positions shown; findings below may reference images not displayed]

FINDINGS: Minimal retrolisthesis of C5-6 is noted secondary to severe
degenerative disc disease at this level. No fracture is noted. No
prevertebral soft tissue swelling is noted. Remaining disc spaces
are unremarkable. Mild bilateral neural foraminal stenosis is noted
at C5-6 secondary to uncovertebral spurring.
IMPRESSION: Severe degenerative disc disease is noted at C5-6 with mild
bilateral neural foraminal stenosis at this level.

## 2022-08-09 ENCOUNTER — Other Ambulatory Visit: Payer: Self-pay

## 2022-08-09 ENCOUNTER — Encounter (HOSPITAL_BASED_OUTPATIENT_CLINIC_OR_DEPARTMENT_OTHER): Payer: Self-pay | Admitting: Emergency Medicine

## 2022-08-09 DIAGNOSIS — E860 Dehydration: Secondary | ICD-10-CM | POA: Insufficient documentation

## 2022-08-09 DIAGNOSIS — N3000 Acute cystitis without hematuria: Secondary | ICD-10-CM | POA: Insufficient documentation

## 2022-08-09 DIAGNOSIS — R112 Nausea with vomiting, unspecified: Secondary | ICD-10-CM | POA: Diagnosis present

## 2022-08-09 LAB — CBC
HCT: 44.2 % (ref 36.0–46.0)
Hemoglobin: 15 g/dL (ref 12.0–15.0)
MCH: 30.5 pg (ref 26.0–34.0)
MCHC: 33.9 g/dL (ref 30.0–36.0)
MCV: 89.8 fL (ref 80.0–100.0)
Platelets: 291 10*3/uL (ref 150–400)
RBC: 4.92 MIL/uL (ref 3.87–5.11)
RDW: 12 % (ref 11.5–15.5)
WBC: 10.3 10*3/uL (ref 4.0–10.5)
nRBC: 0 % (ref 0.0–0.2)

## 2022-08-09 LAB — URINALYSIS, ROUTINE W REFLEX MICROSCOPIC
Bilirubin Urine: NEGATIVE
Glucose, UA: NEGATIVE mg/dL
Ketones, ur: >80 mg/dL — AB
Leukocytes,Ua: NEGATIVE
Nitrite: NEGATIVE
Protein, ur: 30 mg/dL — AB
Specific Gravity, Urine: 1.035 — ABNORMAL HIGH (ref 1.005–1.030)
pH: 6 (ref 5.0–8.0)

## 2022-08-09 LAB — BASIC METABOLIC PANEL
Anion gap: 18 — ABNORMAL HIGH (ref 5–15)
BUN: 16 mg/dL (ref 6–20)
CO2: 17 mmol/L — ABNORMAL LOW (ref 22–32)
Calcium: 9.2 mg/dL (ref 8.9–10.3)
Chloride: 100 mmol/L (ref 98–111)
Creatinine, Ser: 0.86 mg/dL (ref 0.44–1.00)
GFR, Estimated: >60 mL/min (ref >60)
Glucose, Bld: 124 mg/dL — ABNORMAL HIGH (ref 70–99)
Potassium: 3.5 mmol/L (ref 3.5–5.1)
Sodium: 135 mmol/L (ref 135–145)

## 2022-08-09 NOTE — ED Triage Notes (Signed)
Pt via pov from home with UTI (e-coli) verified by pcp today from test on Thursday. Pt reports she has frequent urinary tract infections. She now has back pain and emesis. Pt alert & oriented, nad noted.

## 2022-08-10 ENCOUNTER — Emergency Department (HOSPITAL_BASED_OUTPATIENT_CLINIC_OR_DEPARTMENT_OTHER)
Admission: EM | Admit: 2022-08-10 | Discharge: 2022-08-10 | Disposition: A | Discharge status: Home / Self Care | Payer: 59 | Attending: Emergency Medicine | Admitting: Emergency Medicine

## 2022-08-10 DIAGNOSIS — N3000 Acute cystitis without hematuria: Secondary | ICD-10-CM

## 2022-08-10 DIAGNOSIS — E860 Dehydration: Secondary | ICD-10-CM

## 2022-08-10 DIAGNOSIS — R112 Nausea with vomiting, unspecified: Secondary | ICD-10-CM

## 2022-08-10 MED ORDER — SODIUM CHLORIDE 0.9 % IV BOLUS
1000.0000 mL | Freq: Once | INTRAVENOUS | Status: AC
  Administered 2022-08-10: 1000 mL via INTRAVENOUS

## 2022-08-10 MED ORDER — CEPHALEXIN 500 MG PO CAPS: 500.0000 mg | ORAL_CAPSULE | Freq: Three times a day (TID) | ORAL | 0 refills | Status: AC

## 2022-08-10 MED ORDER — ONDANSETRON 4 MG PO TBDP: 4.0000 mg | ORAL_TABLET | Freq: Three times a day (TID) | ORAL | 0 refills | Status: AC | PRN

## 2022-08-10 MED ORDER — SODIUM CHLORIDE 0.9 % IV SOLN
1.0000 g | Freq: Once | INTRAVENOUS | Status: AC
  Administered 2022-08-10: 1 g via INTRAVENOUS
  Filled 2022-08-10: qty 10

## 2022-08-10 MED ORDER — ONDANSETRON HCL 4 MG/2ML IJ SOLN
4.0000 mg | Freq: Once | INTRAMUSCULAR | Status: AC
  Administered 2022-08-10: 4 mg via INTRAVENOUS
  Filled 2022-08-10: qty 2

## 2022-08-10 NOTE — ED Notes (Signed)
Pt verbalizes understanding of discharge instructions. Opportunity for questioning and answers were provided. Pt discharged from ED to home with significant other.   ? ?

## 2022-08-10 NOTE — ED Provider Notes (Signed)
[facility] Provider Note   CSN: [id number] Arrival date & time: 08/09/22  2028     History  Chief Complaint  Patient presents with   Urinary Tract Infection    Nicole Kelly is a 41 y.o. female.  HPI     This is a 41 year old female who presents with concerns for nausea, vomiting, UTI.  Patient reports that she was diagnosed with a UTI by her primary physician.  She was given Macrobid.  Over the last 24 hours she has had nausea and vomiting.  She has not been able to keep anything down.  She has also experienced bilateral back discomfort.  No fevers.  No known sick contacts.  Patient has not taken her antibiotic over the last 12 hours as she thought this may be contributing to her nausea and vomiting.  Urine culture was reviewed and is pansensitive E. coli.  Home Medications Prior to Admission medications   Medication Sig Start Date End Date Taking? Authorizing Provider  cephALEXin (KEFLEX) 500 MG capsule Take 1 capsule (500 mg total) by mouth 3 (three) times daily. 08/10/22  Yes Azarion Hove, Barbette Hair, MD  ondansetron (ZOFRAN-ODT) 4 MG disintegrating tablet Take 1 tablet (4 mg total) by mouth every 8 (eight) hours as needed for nausea or vomiting. 08/10/22  Yes Kimyata Milich, Barbette Hair, MD  acetaminophen (TYLENOL) 500 MG tablet Take 2 tablets (1,000 mg total) by mouth every 8 (eight) hours as needed. 06/25/20   Darr, Edison Nasuti, PA-C  buPROPion (WELLBUTRIN XL) 150 MG 24 hr tablet Take 150 mg by mouth daily.      [provider]  cetirizine (ZYRTEC) 10 MG tablet Take 10 mg by mouth daily.    [provider]  ibuprofen (ADVIL,MOTRIN) 800 MG tablet Take 1 tablet (800 mg total) by mouth every 8 (eight) hours as needed for moderate pain. 11/26/13   Molli Posey, MD  Levonorgest-Eth Radene Journey 91-Day (SEASONIQUE PO) Take by mouth.    [provider]  Multiple Vitamin tablet Take 1 tablet by mouth daily.    [provider]  sertraline  (ZOLOFT) 25 MG tablet Take 25 mg by mouth daily.    [provider]      Allergies    Penicillins and Sulfa antibiotics    Review of Systems   Review of Systems  Constitutional:  Negative for fever.  Gastrointestinal:  Positive for nausea and vomiting. Negative for abdominal pain.  Musculoskeletal:  Positive for back pain.  All other systems reviewed and are negative.   Physical Exam Updated Vital Signs BP 126/87   Pulse 96   Temp 98.4 F (36.9 C)   Resp 18   Ht 1.626 m (5\' 4" )   Wt 56.2 kg   SpO2 100%   BMI 21.28 kg/m  Physical Exam Vitals and nursing note reviewed.  Constitutional:      Appearance: She is well-developed. She is not ill-appearing.  HENT:     Head: Normocephalic and atraumatic.     Mouth/Throat:     Mouth: Mucous membranes are dry.  Eyes:     Pupils: Pupils are equal, round, and reactive to light.  Cardiovascular:     Rate and Rhythm: Normal rate and regular rhythm.     Heart sounds: Normal heart sounds.  Pulmonary:     Effort: Pulmonary effort is normal. No respiratory distress.     Breath sounds: No wheezing.  Abdominal:     General: Bowel sounds are normal.     Palpations:  Abdomen is soft.     Tenderness: There is no right CVA tenderness or left CVA tenderness.  Musculoskeletal:     Cervical back: Neck supple.  Skin:    General: Skin is warm and dry.  Neurological:     Mental Status: She is alert and oriented to person, place, and time.  Psychiatric:        Mood and Affect: Mood normal.     ED Results / Procedures / Treatments   Labs (all labs ordered are listed, but only abnormal results are displayed) Labs Reviewed  URINALYSIS, ROUTINE W REFLEX MICROSCOPIC - Abnormal; Notable for the following components:      Result Value   Specific Gravity, Urine 1.035 (*)    Hgb urine dipstick MODERATE (*)    Ketones, ur >80 (*)    Protein, ur 30 (*)    Bacteria, UA RARE (*)    All other components within normal limits  BASIC  METABOLIC PANEL - Abnormal; Notable for the following components:   CO2 17 (*)    Glucose, Bld 124 (*)    Anion gap 18 (*)    All other components within normal limits  URINE CULTURE  CBC    EKG None  Radiology No results found.  Procedures Procedures    Medications Ordered in ED Medications  sodium chloride 0.9 % bolus 1,000 mL (1,000 mLs Intravenous New Bag/Given 08/10/22 0141)  cefTRIAXone (ROCEPHIN) 1 g in sodium chloride 0.9 % 100 mL IVPB (0 g Intravenous Stopped 08/10/22 0222)  ondansetron (ZOFRAN) injection 4 mg (4 mg Intravenous Given 08/10/22 0141)    ED Course/ Medical Decision Making/ A&P                           Medical Decision Making Amount and/or Complexity of Data Reviewed Labs: ordered.  Risk Prescription drug management.   This patient presents to the ED for concern of nausea and vomiting, this involves an extensive number of treatment options, and is a complaint that carries with it a high risk of complications and morbidity.  I considered the following differential and admission for this acute, potentially life threatening condition.  The differential diagnosis includes pyelonephritis, gastritis, gastroenteritis, adverse medication effect  MDM:    This is a 41 year old female who presents with nausea and vomiting.  Currently being treated for UTI.  Denies fevers but has had some back discomfort.  Onset of nausea and vomiting was after starting her antibiotic.  She is overall nontoxic-appearing.  She is clinically dry appearing with mild tachycardia.  No CVA tenderness.  She reports that the back pain is bilateral.  This would be atypical for pyelonephritis.  Labs obtained and reviewed.  No leukocytosis.  Patient does have evidence of dehydration with ketones in her urine.  She also has a slight anion gap likely related to dehydration.  She was given fluids.  I have reviewed her urine culture which was pansensitive E. coli.  She was given IV Rocephin and  we will switch her antibiotic to Keflex.  Given absence of CVA tenderness and normal white count without fevers, do not feel she needs advanced imaging at this time.  Patient is able to tolerate fluids at discharge.  Will discharge with Zofran and Keflex.  (Labs, imaging, consults)  Labs: I Ordered, and personally interpreted labs.  The pertinent results include: CBC, BMP, urinalysis, urine culture  Imaging Studies ordered: I ordered imaging studies including none I independently  visualized and interpreted imaging. I agree with the radiologist interpretation  Additional history obtained from husband at bedside.  External records from outside source obtained and reviewed including prior evaluations  Cardiac Monitoring: The patient was maintained on a cardiac monitor.  I personally viewed and interpreted the cardiac monitored which showed an underlying rhythm of: Sinus rhythm  Reevaluation: After the interventions noted above, I reevaluated the patient and found that they have :improved  Social Determinants of Health: Lives independently  Disposition: Discharge  Co morbidities that complicate the patient evaluation  Past Medical History:  Diagnosis Date   Anxiety    Depression    Hx of varicella    Pregnancy induced hypertension      Medicines Meds ordered this encounter  Medications   sodium chloride 0.9 % bolus 1,000 mL   cefTRIAXone (ROCEPHIN) 1 g in sodium chloride 0.9 % 100 mL IVPB    Order Specific Question:   Antibiotic Indication:    Answer:   UTI   ondansetron (ZOFRAN) injection 4 mg   cephALEXin (KEFLEX) 500 MG capsule    Sig: Take 1 capsule (500 mg total) by mouth 3 (three) times daily.    Dispense:  21 capsule    Refill:  0   ondansetron (ZOFRAN-ODT) 4 MG disintegrating tablet    Sig: Take 1 tablet (4 mg total) by mouth every 8 (eight) hours as needed for nausea or vomiting.    Dispense:  20 tablet    Refill:  0    I have reviewed the patients home  medicines and have made adjustments as needed  Problem List / ED Course: Problem List Items Addressed This Visit   None Visit Diagnoses     Acute cystitis without hematuria    -  Primary   Nausea and vomiting, unspecified vomiting type       Dehydration                       Final Clinical Impression(s) / ED Diagnoses Final diagnoses:  Acute cystitis without hematuria  Nausea and vomiting, unspecified vomiting type  Dehydration    Rx / DC Orders ED Discharge Orders          Ordered    cephALEXin (KEFLEX) 500 MG capsule  3 times daily        08/10/22 0256    ondansetron (ZOFRAN-ODT) 4 MG disintegrating tablet  Every 8 hours PRN        08/10/22 0256              Shon Baton, MD 08/10/22 0301

## 2022-08-10 NOTE — Discharge Instructions (Signed)
You were seen today for nausea and vomiting.  Your labs show evidence of dehydration.  Take Zofran as needed for persistent nausea.  Make sure that you are staying hydrated.  Your antibiotic will be switched for better kidney coverage but also to see if you tolerate this better.

## 2022-08-11 LAB — URINE CULTURE: Culture: NO GROWTH
